# Patient Record
Sex: Male | Born: 1950 | Race: White | Hispanic: No | Marital: Single | Smoking: Never smoker
Health system: Southern US, Community
[De-identification: ages and names within clinical notes are randomized; demographics above are authoritative.]

## PROBLEM LIST (undated history)

## (undated) DIAGNOSIS — I10 Essential (primary) hypertension: Secondary | ICD-10-CM

## (undated) DIAGNOSIS — E785 Hyperlipidemia, unspecified: Secondary | ICD-10-CM

## (undated) HISTORY — PX: KNEE SURGERY: SHX244

## (undated) HISTORY — PX: ANKLE SURGERY: SHX546

## (undated) HISTORY — PX: NOSE SURGERY: SHX723

---

## 2014-07-05 ENCOUNTER — Emergency Department (HOSPITAL_COMMUNITY)
Admission: EM | Admit: 2014-07-05 | Discharge: 2014-07-05 | Disposition: A | Discharge status: Home / Self Care | Payer: Medicare Other | Attending: Emergency Medicine | Admitting: Emergency Medicine

## 2014-07-05 ENCOUNTER — Encounter (HOSPITAL_COMMUNITY): Payer: Self-pay | Admitting: Emergency Medicine

## 2014-07-05 DIAGNOSIS — R059 Cough, unspecified: Secondary | ICD-10-CM | POA: Insufficient documentation

## 2014-07-05 DIAGNOSIS — Z880 Allergy status to penicillin: Secondary | ICD-10-CM | POA: Insufficient documentation

## 2014-07-05 DIAGNOSIS — J3489 Other specified disorders of nose and nasal sinuses: Secondary | ICD-10-CM | POA: Diagnosis not present

## 2014-07-05 DIAGNOSIS — Z8619 Personal history of other infectious and parasitic diseases: Secondary | ICD-10-CM | POA: Insufficient documentation

## 2014-07-05 DIAGNOSIS — Z7982 Long term (current) use of aspirin: Secondary | ICD-10-CM | POA: Diagnosis not present

## 2014-07-05 DIAGNOSIS — R5381 Other malaise: Secondary | ICD-10-CM | POA: Diagnosis present

## 2014-07-05 DIAGNOSIS — G510 Bell's palsy: Secondary | ICD-10-CM | POA: Diagnosis not present

## 2014-07-05 DIAGNOSIS — Z792 Long term (current) use of antibiotics: Secondary | ICD-10-CM | POA: Diagnosis not present

## 2014-07-05 DIAGNOSIS — Z79899 Other long term (current) drug therapy: Secondary | ICD-10-CM | POA: Diagnosis not present

## 2014-07-05 DIAGNOSIS — R5383 Other fatigue: Secondary | ICD-10-CM

## 2014-07-05 DIAGNOSIS — I10 Essential (primary) hypertension: Secondary | ICD-10-CM | POA: Diagnosis not present

## 2014-07-05 DIAGNOSIS — R05 Cough: Secondary | ICD-10-CM | POA: Diagnosis not present

## 2014-07-05 HISTORY — DX: Essential (primary) hypertension: I10

## 2014-07-05 MED ORDER — VALACYCLOVIR HCL 1 G PO TABS: 1000.0000 mg | ORAL_TABLET | Freq: Three times a day (TID) | ORAL | Status: AC

## 2014-07-05 MED ORDER — PREDNISONE 10 MG PO TABS: ORAL_TABLET | ORAL | Status: DC

## 2014-07-05 MED ORDER — PREDNISONE 20 MG PO TABS
60.0000 mg | ORAL_TABLET | Freq: Once | ORAL | Status: AC
  Administered 2014-07-05: 60 mg via ORAL
  Filled 2014-07-05: qty 3

## 2014-07-05 MED ORDER — FLUORESCEIN SODIUM 1 MG OP STRP
1.0000 | ORAL_STRIP | Freq: Once | OPHTHALMIC | Status: AC
  Administered 2014-07-05: 1 via OPHTHALMIC
  Filled 2014-07-05: qty 1

## 2014-07-05 MED ORDER — VALACYCLOVIR HCL 500 MG PO TABS
1000.0000 mg | ORAL_TABLET | Freq: Once | ORAL | Status: AC
  Administered 2014-07-05: 1000 mg via ORAL
  Filled 2014-07-05 (×2): qty 2

## 2014-07-05 MED ORDER — PREDNISONE 10 MG PO TABS: 60.0000 mg | ORAL_TABLET | Freq: Every day | ORAL | Status: DC

## 2014-07-05 NOTE — ED Notes (Addendum)
Pt states L sided facial paralysis, also states L eye discomfort and watering. Onset Friday evening, L sided facial droop noted. Pt states numbness/tingling to L side of face. Speech clear, able to move all extremities. Strong equal bilateral grip strengths. Pt is alert and oriented x4.

## 2014-07-05 NOTE — ED Notes (Signed)
Pt presents with left side facial palsy onset Friday nite into Saturday. Speech clear, left sided facial droop. Equal grips.

## 2014-07-05 NOTE — Discharge Instructions (Signed)
Bell's Palsy °Bell's palsy is a condition in which the muscles on one side of the face cannot move (paralysis). This is because the nerves in the face are paralyzed. It is most often thought to be caused by a virus. The virus causes swelling of the nerve that controls movement on one side of the face. The nerve travels through a tight space surrounded by bone. When the nerve swells, it can be compressed by the bone. This results in damage to the protective covering around the nerve. This damage interferes with how the nerve communicates with the muscles of the face. As a result, it can cause weakness or paralysis of the facial muscles.  °Injury (trauma), tumor, and surgery may cause Bell's palsy, but most of the time the cause is unknown. It is a relatively common condition. It starts suddenly (abrupt onset) with the paralysis usually ending within 2 days. Bell's palsy is not dangerous. But because the eye does not close properly, you may need care to keep the eye from getting dry. This can include splinting (to keep the eye shut) or moistening with artificial tears. Bell's palsy very seldom occurs on both sides of the face at the same time. °SYMPTOMS  °· Eyebrow sagging. °· Drooping of the eyelid and corner of the mouth. °· Inability to close one eye. °· Loss of taste on the front of the tongue. °· Sensitivity to loud noises. °TREATMENT  °The treatment is usually non-surgical. If the patient is seen within the first 24 to 48 hours, a short course of steroids may be prescribed, in an attempt to shorten the length of the condition. Antiviral medicines may also be used with the steroids, but it is unclear if they are helpful.  °You will need to protect your eye, if you cannot close it. The cornea (clear covering over your eye) will become dry and can be damaged. Artificial tears can be used to keep your eye moist. Glasses or an eye patch should be worn to protect your eye. °PROGNOSIS  °Recovery is variable, ranging  from days to months. Although the problem usually goes away completely (about 80% of cases resolve), predicting the outcome is impossible. Most people improve within 3 weeks of when the symptoms began. Improvement may continue for 3 to 6 months. A small number of people have moderate to severe weakness that is permanent.  °HOME CARE INSTRUCTIONS  °· If your caregiver prescribed medication to reduce swelling in the nerve, use as directed. Do not stop taking the medication unless directed by your caregiver. °· Use moisturizing eye drops as needed to prevent drying of your eye, as directed by your caregiver. °· Protect your eye, as directed by your caregiver. °· Use facial massage and exercises, as directed by your caregiver. °· Perform your normal activities, and get your normal rest. °SEEK IMMEDIATE MEDICAL CARE IF:  °· There is pain, redness or irritation in the eye. °· You or your child has an oral temperature above 102° F (38.9° C), not controlled by medicine. °MAKE SURE YOU:  °· Understand these instructions. °· Will watch your condition. °· Will get help right away if you are not doing well or get worse. °Document Released: 11/06/2005 Document Revised: 01/29/2012 Document Reviewed: 02/13/2014 °ExitCare® Patient Information ©2015 [employer]. This information is not intended to replace advice given to you by your health care provider. Make sure you discuss any questions you have with your health care provider. ° °

## 2014-07-05 NOTE — ED Provider Notes (Signed)
CSN: [id number]     Arrival date & time 07/05/14  1605 History   First MD Initiated Contact with Patient 07/05/14 1623     Chief Complaint  Patient presents with  . Weakness     (Consider location/radiation/quality/duration/timing/severity/associated sxs/prior Treatment) Patient is a 63 y.o. male presenting with weakness. The history is provided by the patient.  Weakness This is a new problem. The current episode started in the past 7 days. The problem occurs constantly. The problem has been gradually worsening. Associated symptoms include congestion, coughing and weakness (facial). Pertinent negatives include no abdominal pain, arthralgias, chest pain, chills, fever, headaches, myalgias, rash or vomiting. Nothing aggravates the symptoms. He has tried nothing for the symptoms. The treatment provided no relief.   63 yo M with a chief complaint of left-sided facial weakness. Patient states this started a couple days ago. Initially started with some foreign body sensation itching to his left thigh. Patient states his eye became red and he started having some pain to the left side of his face. Patient was trying to drink something yesterday and realizes pooling of the left side of his mouth. Family urged him to come to the emergency department for evaluation. Patient with a history of shingles states that this feels the same. Patient denies any extremity weakness denies any numbness or tingling. Patient endorses some congestion cough.  Past Medical History  Diagnosis Date  . Hypertension    Past Surgical History  Procedure Laterality Date  . Ankle surgery    . Knee surgery    . Nose surgery     No family history on file. History  Substance Use Topics  . Smoking status: Never Smoker   . Smokeless tobacco: Not on file  . Alcohol Use: Yes    Review of Systems  Constitutional: Negative for fever and chills.  HENT: Positive for congestion. Negative for facial swelling.   Eyes: Negative  for discharge and visual disturbance.  Respiratory: Positive for cough. Negative for shortness of breath.   Cardiovascular: Negative for chest pain and palpitations.  Gastrointestinal: Negative for vomiting, abdominal pain and diarrhea.  Musculoskeletal: Negative for arthralgias and myalgias.  Skin: Negative for color change and rash.  Neurological: Positive for weakness (facial). Negative for tremors, syncope and headaches.  Psychiatric/Behavioral: Negative for confusion and dysphoric mood.      Allergies  Penicillins; Avelox; Xylocaine; Achromycin; Aureomycin; Cocaine; Erythromycin; Levaquin; Lincomycin; Other; Peanut-containing drug products; and Thorazine  Home Medications   Prior to Admission medications   Medication Sig Start Date End Date Taking? Authorizing Provider  amLODipine-olmesartan (AZOR) 5-40 MG per tablet Take 1 tablet by mouth daily.   Yes Historical Provider, MD  aspirin EC 81 MG tablet Take 81 mg by mouth daily.   Yes Historical Provider, MD  cefUROXime (CEFTIN) 250 MG tablet Take 250 mg by mouth 2 (two) times daily. 07/01/14  Yes Historical Provider, MD  Coenzyme Q10 (CO Q 10 PO) Take 1 tablet by mouth daily.   Yes Historical Provider, MD  Flaxseed, Linseed, (FLAX SEED OIL PO) Take 1 tablet by mouth daily.   Yes Historical Provider, MD  GARLIC PO Take 1 tablet by mouth daily.   Yes Historical Provider, MD  hydrOXYzine (ATARAX/VISTARIL) 25 MG tablet Take 25 mg by mouth daily as needed for itching (or allergies).   Yes Historical Provider, MD  Ketotifen Fumarate (ALAWAY OP) Place 1 drop into both eyes 2 (two) times daily as needed (for itchy, dry eyes).   Yes Historical  Provider, MD  Multiple Vitamins-Minerals (MULTIVITAMIN PO) Take 1 tablet by mouth daily.   Yes Historical Provider, MD  Omega-3 Fatty Acids (FISH OIL PO) Take 1 tablet by mouth daily.   Yes Historical Provider, MD  predniSONE (DELTASONE) 10 MG tablet Take 60 mg for the next four days.  Then take 50mg ,  40mg , 30mg , 20mg , 10mg . 07/05/14   Deno Etienne, MD  valACYclovir (VALTREX) 1000 MG tablet Take 1 tablet (1,000 mg total) by mouth 3 (three) times daily. 07/05/14 07/11/14  Deno Etienne, MD   BP 155/102  Pulse 113  Temp(Src) 98.2 F (36.8 C) (Oral)  Resp 20  Ht 5\' 10"  (1.778 m)  Wt 273 lb (123.832 kg)  BMI 39.17 kg/m2  SpO2 95% Physical Exam  Constitutional: He is oriented to person, place, and time. He appears well-developed and well-nourished.  HENT:  Head: Normocephalic and atraumatic.  Eyes: EOM are normal. Pupils are equal, round, and reactive to light. Lids are everted and swept, no foreign bodies found. Left eye exhibits exudate (watery). No foreign body present in the left eye. Left conjunctiva is injected. Left conjunctiva has no hemorrhage. Left eye exhibits normal extraocular motion.  Slit lamp exam:      The right eye shows no fluorescein uptake.       The left eye shows no corneal abrasion, no corneal ulcer, no hyphema, no hypopyon and no fluorescein uptake.  Neck: Normal range of motion. Neck supple. No JVD present.  Cardiovascular: Normal rate and regular rhythm.  Exam reveals no gallop and no friction rub.   No murmur heard. Pulmonary/Chest: No respiratory distress. He has no wheezes.  Abdominal: He exhibits no distension. There is no rebound and no guarding.  Musculoskeletal: Normal range of motion.  Neurological: He is alert and oriented to person, place, and time. He has normal strength. A cranial nerve deficit is present. No sensory deficit. He displays a negative Romberg sign. Coordination and gait normal. GCS eye subscore is 4. GCS verbal subscore is 5. GCS motor subscore is 6. He displays no Babinski's sign on the right side. He displays no Babinski's sign on the left side.  Reflex Scores:      Tricep reflexes are 2+ on the right side and 2+ on the left side.      Bicep reflexes are 2+ on the right side and 2+ on the left side.      Brachioradialis reflexes are 2+ on the  right side and 2+ on the left side.      Patellar reflexes are 2+ on the right side and 2+ on the left side.      Achilles reflexes are 2+ on the right side and 2+ on the left side. Paralysis of the left side of his face NOT sparing the forehead  Skin: No rash noted. No pallor.  Psychiatric: He has a normal mood and affect. His behavior is normal.    ED Course  Procedures (including critical care time) Labs Review Labs Reviewed - No data to display  Imaging Review No results found.   EKG Interpretation None      MDM   Final diagnoses:  Bell's palsy    63 yo M with a chief complaint left-sided facial weakness. This is not sparing the forehead. Patient with left itching eye, hx of zoster, fluorescein exam of the left side.  Fluorescein exam without noted herpetic lesions.  We'll discharge the patient home with a valacyclovir and prednisone. He'll follow with his PCP.  5:29 PM:  I have discussed the diagnosis/risks/treatment options with the patient and believe the pt to be eligible for discharge home to follow-up with PCP. We also discussed returning to the ED immediately if new or worsening sx occur. We discussed the sx which are most concerning (e.g., worsening vision loss) that necessitate immediate return. Medications administered to the patient during their visit and any new prescriptions provided to the patient are listed below.  Medications given during this visit Medications  predniSONE (DELTASONE) tablet 60 mg (not administered)  valACYclovir (VALTREX) tablet 1,000 mg (not administered)  fluorescein ophthalmic strip 1 strip (1 strip Left Eye Given 07/05/14 1652)    New Prescriptions   PREDNISONE (DELTASONE) 10 MG TABLET    Take 60 mg for the next four days.  Then take 50mg , 40mg , 30mg , 20mg , 10mg .   VALACYCLOVIR (VALTREX) 1000 MG TABLET    Take 1 tablet (1,000 mg total) by mouth 3 (three) times daily.     Deno Etienne, MD 07/05/14 [phone]

## 2014-07-05 NOTE — ED Provider Notes (Signed)
Patient with left-sided Bell's palsy. We'll give steroids and valacyclovir due 2 previous zoster  Jasper Riling. Alvino Chapel, MD 07/05/14 [phone]

## 2016-01-30 ENCOUNTER — Emergency Department (HOSPITAL_COMMUNITY): Payer: Medicare HMO

## 2016-01-30 ENCOUNTER — Encounter (HOSPITAL_COMMUNITY): Payer: Self-pay

## 2016-01-30 ENCOUNTER — Emergency Department (HOSPITAL_COMMUNITY)
Admission: EM | Admit: 2016-01-30 | Discharge: 2016-01-30 | Disposition: A | Discharge status: Home / Self Care | Payer: Medicare HMO | Attending: Emergency Medicine | Admitting: Emergency Medicine

## 2016-01-30 DIAGNOSIS — Z880 Allergy status to penicillin: Secondary | ICD-10-CM | POA: Diagnosis not present

## 2016-01-30 DIAGNOSIS — N201 Calculus of ureter: Secondary | ICD-10-CM | POA: Insufficient documentation

## 2016-01-30 DIAGNOSIS — E785 Hyperlipidemia, unspecified: Secondary | ICD-10-CM | POA: Insufficient documentation

## 2016-01-30 DIAGNOSIS — I10 Essential (primary) hypertension: Secondary | ICD-10-CM | POA: Insufficient documentation

## 2016-01-30 DIAGNOSIS — Z79899 Other long term (current) drug therapy: Secondary | ICD-10-CM | POA: Diagnosis not present

## 2016-01-30 DIAGNOSIS — Z7982 Long term (current) use of aspirin: Secondary | ICD-10-CM | POA: Insufficient documentation

## 2016-01-30 DIAGNOSIS — R1032 Left lower quadrant pain: Secondary | ICD-10-CM | POA: Diagnosis present

## 2016-01-30 LAB — COMPREHENSIVE METABOLIC PANEL
ALT: 23 U/L (ref 17–63)
ANION GAP: 14 (ref 5–15)
AST: 29 U/L (ref 15–41)
Albumin: 4.2 g/dL (ref 3.5–5.0)
Alkaline Phosphatase: 32 U/L — ABNORMAL LOW (ref 38–126)
BILIRUBIN TOTAL: 0.3 mg/dL (ref 0.3–1.2)
BUN: 21 mg/dL — ABNORMAL HIGH (ref 6–20)
CALCIUM: 9.8 mg/dL (ref 8.9–10.3)
CO2: 22 mmol/L (ref 22–32)
Chloride: 103 mmol/L (ref 101–111)
Creatinine, Ser: 1.54 mg/dL — ABNORMAL HIGH (ref 0.61–1.24)
GFR calc non Af Amer: 45 mL/min — ABNORMAL LOW (ref >60)
GFR, EST AFRICAN AMERICAN: 53 mL/min — AB (ref >60)
Glucose, Bld: 158 mg/dL — ABNORMAL HIGH (ref 65–99)
Potassium: 3.7 mmol/L (ref 3.5–5.1)
Sodium: 139 mmol/L (ref 135–145)
TOTAL PROTEIN: 7.2 g/dL (ref 6.5–8.1)

## 2016-01-30 LAB — URINALYSIS, ROUTINE W REFLEX MICROSCOPIC
BILIRUBIN URINE: NEGATIVE
Glucose, UA: NEGATIVE mg/dL
Ketones, ur: NEGATIVE mg/dL
Leukocytes, UA: NEGATIVE
NITRITE: NEGATIVE
Protein, ur: 100 mg/dL — AB
Specific Gravity, Urine: 1.024 (ref 1.005–1.030)
pH: 5 (ref 5.0–8.0)

## 2016-01-30 LAB — LIPASE, BLOOD: Lipase: 61 U/L — ABNORMAL HIGH (ref 11–51)

## 2016-01-30 LAB — CBC
HCT: 41.2 % (ref 39.0–52.0)
HEMOGLOBIN: 13.2 g/dL (ref 13.0–17.0)
MCH: 28.8 pg (ref 26.0–34.0)
MCHC: 32 g/dL (ref 30.0–36.0)
MCV: 89.8 fL (ref 78.0–100.0)
Platelets: 243 10*3/uL (ref 150–400)
RBC: 4.59 MIL/uL (ref 4.22–5.81)
RDW: 12.7 % (ref 11.5–15.5)
WBC: 9.1 10*3/uL (ref 4.0–10.5)

## 2016-01-30 LAB — URINE MICROSCOPIC-ADD ON
Bacteria, UA: NONE SEEN
SQUAMOUS EPITHELIAL / LPF: NONE SEEN
WBC, UA: NONE SEEN WBC/hpf (ref 0–5)

## 2016-01-30 MED ORDER — TAMSULOSIN HCL 0.4 MG PO CAPS: 0.4000 mg | ORAL_CAPSULE | Freq: Every day | ORAL | Status: DC

## 2016-01-30 MED ORDER — MORPHINE SULFATE (PF) 4 MG/ML IV SOLN
4.0000 mg | Freq: Once | INTRAVENOUS | Status: AC
  Administered 2016-01-30: 4 mg via INTRAVENOUS
  Filled 2016-01-30: qty 1

## 2016-01-30 MED ORDER — ONDANSETRON HCL 4 MG/2ML IJ SOLN
4.0000 mg | Freq: Once | INTRAMUSCULAR | Status: AC
  Administered 2016-01-30: 4 mg via INTRAVENOUS
  Filled 2016-01-30: qty 2

## 2016-01-30 MED ORDER — SODIUM CHLORIDE 0.9 % IV BOLUS (SEPSIS)
1000.0000 mL | Freq: Once | INTRAVENOUS | Status: AC
  Administered 2016-01-30: 1000 mL via INTRAVENOUS

## 2016-01-30 MED ORDER — OXYCODONE-ACETAMINOPHEN 5-325 MG PO TABS: 2.0000 | ORAL_TABLET | ORAL | Status: DC | PRN

## 2016-01-30 MED ORDER — HYDROMORPHONE HCL 1 MG/ML IJ SOLN
1.0000 mg | Freq: Once | INTRAMUSCULAR | Status: AC
  Administered 2016-01-30: 1 mg via INTRAVENOUS
  Filled 2016-01-30: qty 1

## 2016-01-30 MED ORDER — IOHEXOL 300 MG/ML  SOLN
80.0000 mL | Freq: Once | INTRAMUSCULAR | Status: AC | PRN
  Administered 2016-01-30: 100 mL via INTRAVENOUS

## 2016-01-30 NOTE — ED Notes (Signed)
Pt placed into gown and on to monitor upon arrival to room. Pt monitored by blood pressure and pulse ox.  

## 2016-01-30 NOTE — ED Notes (Signed)
Onset this morning left mid to lower back pain radiating at times around to left lower abd.  Vomited x 1.  No urinary symptoms.  Pt ambulated to room could not sit in wheelchair.

## 2016-01-30 NOTE — Discharge Instructions (Signed)
Kidney Stones Return for fever, vomiting, inability to pass urine, or worsening pain. Drink plenty of fluids. Take medications as prescribed. Follow-up with urology. Kidney stones (urolithiasis) are solid masses that form inside your kidneys. The intense pain is caused by the stone moving through the kidney, ureter, bladder, and urethra (urinary tract). When the stone moves, the ureter starts to spasm around the stone. The stone is usually passed in your pee (urine).  HOME CARE  Drink enough fluids to keep your pee clear or pale yellow. This helps to get the stone out.  Take a 24-hour pee (urine) sample as told by your doctor. You may need to take another sample every 6-12 months.  Strain all pee through the provided strainer. Do not pee without peeing through the strainer, not even once. If you pee the stone out, catch it in the strainer. The stone may be as small as a grain of salt. Take this to your doctor. This will help your doctor figure out what you can do to try to prevent more kidney stones.  Only take medicine as told by your doctor.  Make changes to your daily diet as told by your doctor. You may be told to:  Limit how much salt you eat.  Eat 5 or more servings of fruits and vegetables each day.  Limit how much meat, poultry, fish, and eggs you eat.  Keep all follow-up visits as told by your doctor. This is important.  Get follow-up X-rays as told by your doctor. GET HELP IF: You have pain that gets worse even if you have been taking pain medicine. GET HELP RIGHT AWAY IF:   Your pain does not get better with medicine.  You have a fever or shaking chills.  Your pain increases and gets worse over 18 hours.  You have new belly (abdominal) pain.  You feel faint or pass out.  You are unable to pee.   This information is not intended to replace advice given to you by your health care provider. Make sure you discuss any questions you have with your health care provider.   Document Released: 04/24/2008 Document Revised: 07/28/2015 Document Reviewed: 04/09/2013 Elsevier Interactive Patient Education [employer].

## 2016-01-30 NOTE — ED Provider Notes (Signed)
CSN: [id number]     Arrival date & time 01/30/16  1314 History   First MD Initiated Contact with Patient 01/30/16 1324     Chief Complaint  Patient presents with  . Flank Pain   (Consider location/radiation/quality/duration/timing/severity/associated sxs/prior Treatment) The history is provided by the patient. No language interpreter was used.   Donald Kim is a 65 y.o male with a history of kidney stones, hypertension and hyperlipidemia who presents for intermittent sharp, worsening left flank pain radiating to the left lower abdomen since 8:00 this morning. Denies any treatment prior to arrival. Reports he is nauseated and had one episode of vomiting prior to arrival. States this is similar to his previous kidney stones several years ago.  He denies any fever, chills, chest pain, shortness of breath, diarrhea, hematuria, urinary frequency, hematochezia.  Past Medical History  Diagnosis Date  . Hypertension    Past Surgical History  Procedure Laterality Date  . Ankle surgery    . Knee surgery    . Nose surgery     History reviewed. No pertinent family history. Social History  Substance Use Topics  . Smoking status: Never Smoker   . Smokeless tobacco: None  . Alcohol Use: Yes    Review of Systems  Constitutional: Negative for fever.  Gastrointestinal: Positive for nausea, vomiting and abdominal pain. Negative for diarrhea and blood in stool.  Genitourinary: Positive for flank pain. Negative for urgency and hematuria.  All other systems reviewed and are negative.     Allergies  Penicillins; Xylocaine; Avelox; Achromycin; Aureomycin; Cocaine; Erythromycin; Levaquin; Lincomycin; Other; and Thorazine  Home Medications   Prior to Admission medications   Medication Sig Start Date End Date Taking? Authorizing Provider  amLODipine-olmesartan (AZOR) 5-40 MG per tablet Take 1 tablet by mouth daily.   Yes Historical Provider, MD  aspirin EC 81 MG tablet Take 81 mg by mouth daily.    Yes Historical Provider, MD  Coenzyme Q10 (CO Q 10 PO) Take 1 tablet by mouth daily.   Yes Historical Provider, MD  fenofibrate micronized (LOFIBRA) 200 MG capsule Take 200 mg by mouth daily. 12/14/15  Yes Historical Provider, MD  Flaxseed, Linseed, (FLAX SEED OIL PO) Take 1 tablet by mouth daily.   Yes Historical Provider, MD  GARLIC PO Take 1 tablet by mouth daily.   Yes Historical Provider, MD  Multiple Vitamins-Minerals (MULTIVITAMIN PO) Take 1 tablet by mouth daily.   Yes Historical Provider, MD  Omega-3 Fatty Acids (FISH OIL PO) Take 1 tablet by mouth daily.   Yes Historical Provider, MD  rosuvastatin (CRESTOR) 20 MG tablet Take 20 mg by mouth daily. 12/14/15  Yes Historical Provider, MD  oxyCODONE-acetaminophen (PERCOCET/ROXICET) 5-325 MG tablet Take 2 tablets by mouth every 4 (four) hours as needed for severe pain. 01/30/16   Herchel Hopkin Patel-Mills, PA-C  predniSONE (DELTASONE) 10 MG tablet Take 60 mg for the next four days.  Then take 50mg , 40mg , 30mg , 20mg , 10mg . 07/05/14   Deno Etienne, DO  tamsulosin (FLOMAX) 0.4 MG CAPS capsule Take 1 capsule (0.4 mg total) by mouth daily. 01/30/16   Bridgett Hattabaugh Patel-Mills, PA-C   BP 125/82 mmHg  Pulse 89  Temp(Src) 97.5 F (36.4 C) (Oral)  Resp 16  Ht 5\' 9"  (1.753 m)  Wt 120.203 kg  BMI 39.12 kg/m2  SpO2 95% Physical Exam  Constitutional: He is oriented to person, place, and time. Vital signs are normal. He appears well-developed and well-nourished.  Diaphoretic. Morbidly obese.  HENT:  Head: Normocephalic and atraumatic.  Eyes: Conjunctivae are normal.  Neck: Normal range of motion. Neck supple.  Cardiovascular: Normal rate, regular rhythm and normal heart sounds.   RRR  Pulmonary/Chest: Effort normal and breath sounds normal.  Lungs clear to auscultation.  Abdominal: Soft. He exhibits no distension. There is no tenderness. There is no rebound, no guarding and no CVA tenderness.  Abdomen is soft and nontender. No distention. No guarding or rebound. No  reproducible CVA tenderness.  Musculoskeletal: Normal range of motion.  Neurological: He is alert and oriented to person, place, and time.  Skin: Skin is warm and dry.  Nursing note and vitals reviewed.   ED Course  Procedures (including critical care time) Labs Review Labs Reviewed  LIPASE, BLOOD - Abnormal; Notable for the following:    Lipase 61 (*)    All other components within normal limits  COMPREHENSIVE METABOLIC PANEL - Abnormal; Notable for the following:    Glucose, Bld 158 (*)    BUN 21 (*)    Creatinine, Ser 1.54 (*)    Alkaline Phosphatase 32 (*)    GFR calc non Af Amer 45 (*)    GFR calc Af Amer 53 (*)    All other components within normal limits  URINALYSIS, ROUTINE W REFLEX MICROSCOPIC (NOT AT [facility]) - Abnormal; Notable for the following:    APPearance CLOUDY (*)    Hgb urine dipstick LARGE (*)    Protein, ur 100 (*)    All other components within normal limits  CBC  URINE MICROSCOPIC-ADD ON    Imaging Review Ct Abdomen Pelvis W Contrast  01/30/2016  CLINICAL DATA:  65 year old male with history of left-sided flank and abdominal pain. EXAM: CT ABDOMEN AND PELVIS WITH CONTRAST TECHNIQUE: Multidetector CT imaging of the abdomen and pelvis was performed using the standard protocol following bolus administration of intravenous contrast. CONTRAST:  195mL OMNIPAQUE IOHEXOL 300 MG/ML  SOLN COMPARISON:  No priors. FINDINGS: Lower chest:  Mild scarring in the visualize lung bases bilaterally. Hepatobiliary: No cystic or solid hepatic lesions. No intra or extrahepatic biliary ductal dilatation. Several small calcified gallstones lie dependently in the gallbladder. No current findings to suggest an acute cholecystitis at this time. Pancreas: No pancreatic mass. No pancreatic ductal dilatation. No pancreatic or peripancreatic fluid or inflammatory changes. Spleen: Unremarkable. Adrenals/Urinary Tract: 6 mm calculus in the middle third of the left ureter (Image 74 of series 2).  Additional calcification in the left renal hilum is vascular. Mild proximal hydroureteronephrosis and perinephric stranding, indicative of very mild left-sided obstruction at this time. Appearance of the kidneys is otherwise normal. Urinary bladder is normal in appearance. Bilateral adrenal glands are normal in appearance. Stomach/Bowel: Normal appearance of the stomach. No pathologic dilatation of small bowel or colon. Numerous colonic diverticulae are noted, without surrounding inflammatory changes to suggest an acute diverticulitis at this time. Normal appendix. Vascular/Lymphatic: Atherosclerosis throughout the abdominal and pelvic vasculature, without evidence of aneurysm. No lymphadenopathy noted in the abdomen or pelvis. Reproductive: Prostate gland and seminal vesicles are unremarkable in appearance. Other: No significant volume of ascites.  No pneumoperitoneum. Musculoskeletal: There are no aggressive appearing lytic or blastic lesions noted in the visualized portions of the skeleton. IMPRESSION: 1. 6 mm calculus in the middle third of the left ureter with very mild proximal left hydroureteronephrosis and left perinephric stranding, indicative of mild obstruction at this time. 2. Cholelithiasis without evidence of acute cholecystitis at this time. 3. Normal appendix. 4. Atherosclerosis. Electronically Signed   By: Mauri Brooklyn.D.  On: 01/30/2016 15:42   I have personally reviewed and evaluated these images and lab results as part of my medical decision-making.   EKG Interpretation None      MDM   Final diagnoses:  Left ureteral stone  Patient presents for left lower quadrant abdominal pain and left-sided CVA tenderness. States it is similar to his previous pain with kidney stones in the past. He is afebrile but appears in significant pain. He is mildly dehydrated with hemoglobin in the urine. He has a 6 mm calculus in the middle third of the left ureter with mild hydroureteronephrosis  and left perinephric stranding.  He has mild obstruction at this time. No appendicitis. He does have diverticulosis but no diverticulitis.  I discussed findings with the patient. I discussed return precautions such as fever, vomiting, inability to pass urine, or increased pain. I discussed that I would be giving him Flomax and Percocet for pain. I also gave him urology follow-up. Patient is in agreement with plan.  Medications  morphine 4 MG/ML injection 4 mg (4 mg Intravenous Given 01/30/16 1401)  ondansetron (ZOFRAN) injection 4 mg (4 mg Intravenous Given 01/30/16 1400)  HYDROmorphone (DILAUDID) injection 1 mg (1 mg Intravenous Given 01/30/16 1452)  ondansetron (ZOFRAN) injection 4 mg (4 mg Intravenous Given 01/30/16 1452)  sodium chloride 0.9 % bolus 1,000 mL (1,000 mLs Intravenous New Bag/Given 01/30/16 1547)  iohexol (OMNIPAQUE) 300 MG/ML solution 80 mL (100 mLs Intravenous Contrast Given 01/30/16 1511)  morphine 4 MG/ML injection 4 mg (4 mg Intravenous Given 01/30/16 1602)   Filed Vitals:   01/30/16 1334 01/30/16 1600  BP: 147/93 125/82  Pulse: 81 89  Temp: 97.5 F (36.4 C)   Resp: 26 [id number], PA-C 01/30/16 1637  Julianne Rice, MD 01/31/16 [phone]

## 2016-02-03 ENCOUNTER — Emergency Department (HOSPITAL_COMMUNITY): Payer: Medicare HMO

## 2016-02-03 ENCOUNTER — Emergency Department (HOSPITAL_COMMUNITY)
Admission: EM | Admit: 2016-02-03 | Discharge: 2016-02-03 | Disposition: A | Discharge status: Home / Self Care | Payer: Medicare HMO | Attending: Emergency Medicine | Admitting: Emergency Medicine

## 2016-02-03 ENCOUNTER — Encounter (HOSPITAL_COMMUNITY): Payer: Self-pay | Admitting: Emergency Medicine

## 2016-02-03 DIAGNOSIS — Z7982 Long term (current) use of aspirin: Secondary | ICD-10-CM | POA: Diagnosis not present

## 2016-02-03 DIAGNOSIS — I10 Essential (primary) hypertension: Secondary | ICD-10-CM | POA: Insufficient documentation

## 2016-02-03 DIAGNOSIS — Z79899 Other long term (current) drug therapy: Secondary | ICD-10-CM | POA: Insufficient documentation

## 2016-02-03 DIAGNOSIS — E785 Hyperlipidemia, unspecified: Secondary | ICD-10-CM | POA: Insufficient documentation

## 2016-02-03 DIAGNOSIS — Z880 Allergy status to penicillin: Secondary | ICD-10-CM | POA: Insufficient documentation

## 2016-02-03 DIAGNOSIS — R1032 Left lower quadrant pain: Secondary | ICD-10-CM | POA: Diagnosis present

## 2016-02-03 DIAGNOSIS — N200 Calculus of kidney: Secondary | ICD-10-CM | POA: Insufficient documentation

## 2016-02-03 HISTORY — DX: Hyperlipidemia, unspecified: E78.5

## 2016-02-03 LAB — CBC WITH DIFFERENTIAL/PLATELET
BASOS ABS: 0 10*3/uL (ref 0.0–0.1)
BASOS PCT: 0 %
Eosinophils Absolute: 0.1 10*3/uL (ref 0.0–0.7)
Eosinophils Relative: 1 %
HCT: 38.7 % — ABNORMAL LOW (ref 39.0–52.0)
HEMOGLOBIN: 12.6 g/dL — AB (ref 13.0–17.0)
LYMPHS PCT: 12 %
Lymphs Abs: 1.1 10*3/uL (ref 0.7–4.0)
MCH: 29.5 pg (ref 26.0–34.0)
MCHC: 32.6 g/dL (ref 30.0–36.0)
MCV: 90.6 fL (ref 78.0–100.0)
MONO ABS: 0.6 10*3/uL (ref 0.1–1.0)
Monocytes Relative: 7 %
NEUTROS ABS: 7.6 10*3/uL (ref 1.7–7.7)
NEUTROS PCT: 80 %
Platelets: 262 10*3/uL (ref 150–400)
RBC: 4.27 MIL/uL (ref 4.22–5.81)
RDW: 13.1 % (ref 11.5–15.5)
WBC: 9.4 10*3/uL (ref 4.0–10.5)

## 2016-02-03 LAB — URINALYSIS, ROUTINE W REFLEX MICROSCOPIC
Bilirubin Urine: NEGATIVE
GLUCOSE, UA: NEGATIVE mg/dL
Ketones, ur: NEGATIVE mg/dL
LEUKOCYTES UA: NEGATIVE
NITRITE: NEGATIVE
PH: 5.5 (ref 5.0–8.0)
Protein, ur: 30 mg/dL — AB
Specific Gravity, Urine: 1.025 (ref 1.005–1.030)

## 2016-02-03 LAB — URINE MICROSCOPIC-ADD ON

## 2016-02-03 LAB — BASIC METABOLIC PANEL
Anion gap: 14 (ref 5–15)
BUN: 22 mg/dL — AB (ref 6–20)
CHLORIDE: 102 mmol/L (ref 101–111)
CO2: 25 mmol/L (ref 22–32)
Calcium: 9.4 mg/dL (ref 8.9–10.3)
Creatinine, Ser: 1.74 mg/dL — ABNORMAL HIGH (ref 0.61–1.24)
GFR calc Af Amer: 45 mL/min — ABNORMAL LOW (ref >60)
GFR calc non Af Amer: 39 mL/min — ABNORMAL LOW (ref >60)
Glucose, Bld: 134 mg/dL — ABNORMAL HIGH (ref 65–99)
POTASSIUM: 4 mmol/L (ref 3.5–5.1)
SODIUM: 141 mmol/L (ref 135–145)

## 2016-02-03 MED ORDER — MORPHINE SULFATE (PF) 4 MG/ML IV SOLN
4.0000 mg | Freq: Once | INTRAVENOUS | Status: AC
  Administered 2016-02-03: 4 mg via INTRAVENOUS
  Filled 2016-02-03: qty 1

## 2016-02-03 MED ORDER — ONDANSETRON HCL 4 MG/2ML IJ SOLN
4.0000 mg | Freq: Once | INTRAMUSCULAR | Status: AC
  Administered 2016-02-03: 4 mg via INTRAVENOUS
  Filled 2016-02-03: qty 2

## 2016-02-03 MED ORDER — OXYCODONE-ACETAMINOPHEN 5-325 MG PO TABS
ORAL_TABLET | ORAL | Status: DC
Start: 2016-02-03 — End: 2020-04-27

## 2016-02-03 MED ORDER — TAMSULOSIN HCL 0.4 MG PO CAPS: 0.4000 mg | ORAL_CAPSULE | Freq: Every day | ORAL | Status: DC

## 2016-02-03 MED ORDER — OXYCODONE-ACETAMINOPHEN 5-325 MG PO TABS: 1.0000 | ORAL_TABLET | Freq: Once | ORAL | Status: DC

## 2016-02-03 MED ORDER — ONDANSETRON HCL 4 MG PO TABS: 4.0000 mg | ORAL_TABLET | Freq: Three times a day (TID) | ORAL | Status: DC | PRN

## 2016-02-03 MED ORDER — SODIUM CHLORIDE 0.9 % IV BOLUS (SEPSIS)
1000.0000 mL | Freq: Once | INTRAVENOUS | Status: AC
  Administered 2016-02-03: 1000 mL via INTRAVENOUS

## 2016-02-03 NOTE — ED Notes (Signed)
Pt c/o left flank pain. States he was seen here on Sunday, 01/30/16 for same problem and was diagnosed with a kidney stone. Per pt, pain has not gotten better and stone has not passed.

## 2016-02-03 NOTE — ED Provider Notes (Signed)
CSN: [id number]     Arrival date & time 02/03/16  [id number] History   First MD Initiated Contact with Patient 02/03/16 [id number]     Chief Complaint  Patient presents with  . Flank Pain     (Consider location/radiation/quality/duration/timing/severity/associated sxs/prior Treatment) HPI   Blood pressure 148/87, pulse 80, temperature 98 F (36.7 C), temperature source Oral, resp. rate 20, height 5\' 9"  (1.753 m), weight 120.203 kg, SpO2 96 %.  Wlliam Kim is a 65 y.o. male complaining of  severe left flank and left lower quadrant pain radiating down to the left groin acute onset at 3 AM with associated nausea and no fever, chills, emesis. Patient was seen 4 days ago and diagnosed with a 6 mm mid left ureteral stone. He states that he take his pain medications that evening and by the next day the pain had spontaneously resolved. He hasn't needed any pain medication since then. He did not follow with urology. He did not take any pain edication this morning because he states he was coming to the emergency room. Patient has history of remote stone greater than 15 years ago. His never required any intervention to pass the stone. Patient denies dysuria, hematuria, urinary frequency, diarrhea, hematochezia.   Past Medical History  Diagnosis Date  . Hypertension   . Hyperlipidemia    Past Surgical History  Procedure Laterality Date  . Ankle surgery    . Knee surgery    . Nose surgery     No family history on file. Social History  Substance Use Topics  . Smoking status: Never Smoker   . Smokeless tobacco: None  . Alcohol Use: Yes     Comment: "occassionally"     Review of Systems  10 systems reviewed and found to be negative, except as noted in the HPI.  Allergies  Penicillins; Xylocaine; Avelox; Achromycin; Aureomycin; Cocaine; Erythromycin; Levaquin; Lincomycin; Other; and Thorazine  Home Medications   Prior to Admission medications   Medication Sig Start Date End Date Taking?  Authorizing Provider  amLODipine-olmesartan (AZOR) 5-40 MG per tablet Take 1 tablet by mouth daily.   Yes Historical Provider, MD  aspirin EC 81 MG tablet Take 81 mg by mouth daily.   Yes Historical Provider, MD  cefUROXime (CEFTIN) 250 MG tablet Take 250 mg by mouth 2 (two) times daily with a meal.   Yes Historical Provider, MD  Coenzyme Q10 (CO Q 10 PO) Take 1 tablet by mouth daily.   Yes Historical Provider, MD  fenofibrate micronized (LOFIBRA) 200 MG capsule Take 200 mg by mouth daily. 12/14/15  Yes Historical Provider, MD  Flaxseed, Linseed, (FLAX SEED OIL PO) Take 1 tablet by mouth daily.   Yes Historical Provider, MD  GARLIC PO Take 1 tablet by mouth daily.   Yes Historical Provider, MD  Multiple Vitamins-Minerals (MULTIVITAMIN PO) Take 1 tablet by mouth daily.   Yes Historical Provider, MD  naproxen sodium (ANAPROX) 220 MG tablet Take 440 mg by mouth daily as needed (pain).   Yes Historical Provider, MD  Omega-3 Fatty Acids (FISH OIL PO) Take 1 tablet by mouth daily.   Yes Historical Provider, MD  rosuvastatin (CRESTOR) 20 MG tablet Take 20 mg by mouth daily. 12/14/15  Yes Historical Provider, MD  ondansetron (ZOFRAN) 4 MG tablet Take 1 tablet (4 mg total) by mouth every 8 (eight) hours as needed for nausea or vomiting. 02/03/16   Mariadejesus Cade, PA-C  oxyCODONE-acetaminophen (PERCOCET/ROXICET) 5-325 MG tablet 1 to 2 tabs PO q6hrs  PRN for pain 02/03/16   Elmyra Ricks Hyatt Capobianco, PA-C  tamsulosin (FLOMAX) 0.4 MG CAPS capsule Take 1 capsule (0.4 mg total) by mouth daily. 02/03/16   Mariajose Mow, PA-C   BP 137/82 mmHg  Pulse 82  Temp(Src) 98 F (36.7 C) (Oral)  Resp 16  Ht 5\' 9"  (1.753 m)  Wt 120.203 kg  BMI 39.12 kg/m2  SpO2 96% Physical Exam  Constitutional: He is oriented to person, place, and time. He appears well-developed and well-nourished. No distress.  HENT:  Head: Normocephalic and atraumatic.  Mouth/Throat: Oropharynx is clear and moist.  Eyes: Conjunctivae and EOM are  normal. Pupils are equal, round, and reactive to light.  Neck: Normal range of motion.  Cardiovascular: Normal rate, regular rhythm and intact distal pulses.   Pulmonary/Chest: Effort normal and breath sounds normal.  Abdominal: Soft. There is no tenderness.  Genitourinary:  No CVA tenderness to percussion bilaterally.  Musculoskeletal: Normal range of motion.  Neurological: He is alert and oriented to person, place, and time.  Skin: He is not diaphoretic.  Psychiatric: He has a normal mood and affect.  Nursing note and vitals reviewed.   ED Course  Procedures (including critical care time) Labs Review Labs Reviewed  URINALYSIS, ROUTINE W REFLEX MICROSCOPIC (NOT AT [facility]) - Abnormal; Notable for the following:    Hgb urine dipstick TRACE (*)    Protein, ur 30 (*)    All other components within normal limits  CBC WITH DIFFERENTIAL/PLATELET - Abnormal; Notable for the following:    Hemoglobin 12.6 (*)    HCT 38.7 (*)    All other components within normal limits  BASIC METABOLIC PANEL - Abnormal; Notable for the following:    Glucose, Bld 134 (*)    BUN 22 (*)    Creatinine, Ser 1.74 (*)    GFR calc non Af Amer 39 (*)    GFR calc Af Amer 45 (*)    All other components within normal limits  URINE MICROSCOPIC-ADD ON - Abnormal; Notable for the following:    Squamous Epithelial / LPF 0-5 (*)    Bacteria, UA RARE (*)    All other components within normal limits    Imaging Review US Renal  02/03/2016  CLINICAL DATA:  Left flank pain EXAM: RENAL / URINARY TRACT ULTRASOUND COMPLETE COMPARISON:  CT abdomen pelvis 01/30/2016 FINDINGS: Right Kidney: Length: 12.4 cm. Echogenicity within normal limits. No mass or hydronephrosis visualized. Left Kidney: Length: 13.7 cm. Negative for obstruction. Note is made of mild obstruction of the left kidney due to ureteral calculus on the prior CT. If the patient continues to have left flank pain, consider follow-up CT abdomen pelvis to exclude ureteral  calculus Bladder: Urinary bladder empty and not evaluated IMPRESSION: Negative for hydronephrosis on the left. Note is made of left hydronephrosis by left ureteral calculus on the recent CT abdomen. The patient may have passed a stone. If symptoms persist, suggest follow-up CT abdomen pelvis without contrast to evaluate for retained ureteral stone. Electronically Signed   By: Franchot Gallo M.D.   On: 02/03/2016 07:31   Ct Renal Stone Study  02/03/2016  CLINICAL DATA:  Left flank pain EXAM: CT ABDOMEN AND PELVIS WITHOUT CONTRAST TECHNIQUE: Multidetector CT imaging of the abdomen and pelvis was performed following the standard protocol without oral or intravenous contrast material administration. COMPARISON:  January 30, 2016 CT abdomen and pelvis FINDINGS: Lower chest: There is mild bibasilar scarring with slight atelectatic change, slightly greater on the right than on the  left. Hepatobiliary: No focal liver lesions are identified on this noncontrast enhanced study. There is cholelithiasis. The gallbladder wall is not appreciably thickened. There is no biliary duct dilatation. Pancreas: No pancreatic mass or inflammatory focus. Spleen: No splenic lesions are identified. Adrenals/Urinary Tract: Adrenals appear normal bilaterally. There is no renal mass on either side. There is no hydronephrosis on the right. There is no right-sided renal or ureteral calculus. Left kidney is diffusely edematous with moderate perinephric stranding on the left. There is relatively mild hydronephrosis on the left. There is a 5 mm calculus in the distal left ureter currently at a level slightly superior to the left acetabulum. This calculus has migrated more distally compared to recent prior study. No new ureteral calculus identified. There is no intrarenal calculus on the left. There is a vascular calcification in the left renal hilum region, stable. Urinary bladder is midline with wall thickness within normal limits. Stomach/Bowel:  There is no bowel wall or mesenteric thickening. No bowel obstruction. No free air or portal venous air. There are sigmoid diverticula without diverticulitis. Vascular/Lymphatic: There is atherosclerotic calcification in multiple mesenteric vessels. Aorta is tortuous without aneurysm. There are foci of calcification in each common iliac artery as well as in the bifurcation region. There is no appreciable adenopathy in the abdomen or pelvis. Reproductive: There are prostatic calculi. Prostate and seminal vesicles appear normal in size and contour. There is no pelvic mass or pelvic fluid collection. Other: Appendix appears unremarkable. There is no abscess or ascites in the abdomen or pelvis. There is a small ventral hernia containing only fat. Musculoskeletal: There is degenerative change in the lower thoracic and lumbar spine regions. There are no blastic or lytic bone lesions. No intramuscular or abdominal wall lesions are apparent. IMPRESSION: The previously noted 5 mm calculus has migrated distally within the left ureter and is now located in the left ureter slightly superior to the level of the left acetabulum. There is relatively mild hydronephrosis on the left with left renal edema and perinephric stranding, increased from recent prior study. There is cholelithiasis. Gallbladder wall is not thickened, and there is no demonstrable pericholecystic fluid. Scattered sigmoid diverticula without diverticulitis. No bowel obstruction. No abscess. Appendix region appears normal. Prostatic calculi present. Small ventral hernia containing only fat. Electronically Signed   By: Lowella Grip III M.D.   On: 02/03/2016 08:46   I have personally reviewed and evaluated these images and lab results as part of my medical decision-making.   EKG Interpretation None      MDM   Final diagnoses:  Kidney stone on left side    Filed Vitals:   02/03/16 0632 02/03/16 0645 02/03/16 0700 02/03/16 0854  BP: 148/87  145/83 141/83 137/82  Pulse: 80 82 84 82  Temp: 98 F (36.7 C)     TempSrc: Oral     Resp: 20   16  Height: 5\' 9"  (1.753 m)     Weight: 120.203 kg     SpO2: 96% 97% 97% 96%    Medications  sodium chloride 0.9 % bolus 1,000 mL (1,000 mLs Intravenous New Bag/Given 02/03/16 0854)  oxyCODONE-acetaminophen (PERCOCET/ROXICET) 5-325 MG per tablet 1 tablet (not administered)  morphine 4 MG/ML injection 4 mg (4 mg Intravenous Given 02/03/16 0752)  ondansetron (ZOFRAN) injection 4 mg (4 mg Intravenous Given 02/03/16 0752)    Kenroy Vicencio is 65 y.o. male presenting with Left renal colic radiating to left lower quadrant and left flank severe, consistent with prior kidney stone  exacerbation without GU or GI symptoms. Abdominal exam benign. Patient afebrile, well-appearing has not tried pain medication this a.m. He is nausea without emesis.  Patient's ultrasound shows interval resolution of hydronephrosis, will obtain CT to further evaluate. Creatinine is elevated at 1.74, this is a very mild increase in his renal function from 1.5 for 4 days ago. Will bolus.   CT shows a 5 mm stone with improving hydronephrosis and progression through to the distal ureter.   Urology consult from Dr. Alyson Ingles appreciated: He has looked at the CAT scan and we have discussed the bump in his creatinine. States that patient will likely pass the stone on his own, he has encouraged him to make the appointment with urology regardless. I stressed to the patient the importance of straining his urine and close follow-up with urology. He has a few Percocet and Flomax at home, will write him a refill. Explained that he should not be taking both at once. Patient verbalized his understanding.  This is a shared visit with the attending physician who personally evaluated the patient and agrees with the care plan.   Evaluation does not show pathology that would require ongoing emergent intervention or inpatient treatment. Pt is  hemodynamically stable and mentating appropriately. Discussed findings and plan with patient/guardian, who agrees with care plan. All questions answered. Return precautions discussed and outpatient follow up given.   New Prescriptions   ONDANSETRON (ZOFRAN) 4 MG TABLET    Take 1 tablet (4 mg total) by mouth every 8 (eight) hours as needed for nausea or vomiting.   OXYCODONE-ACETAMINOPHEN (PERCOCET/ROXICET) 5-325 MG TABLET    1 to 2 tabs PO q6hrs  PRN for pain   TAMSULOSIN (FLOMAX) 0.4 MG CAPS CAPSULE    Take 1 capsule (0.4 mg total) by mouth daily.         Elmyra Ricks Soha Thorup, PA-C 02/03/16 [id number]  Blanchie Dessert, MD 02/03/16 1535

## 2016-02-03 NOTE — Discharge Instructions (Signed)
Continue to strain urine and urine.   It is very important that you make an appointment with urology as soon as possible.  Do not hesitate to return to the emergency room for any new, worsening or concerning symptoms.   Kidney Stones Kidney stones (urolithiasis) are deposits that form inside your kidneys. The intense pain is caused by the stone moving through the urinary tract. When the stone moves, the ureter goes into spasm around the stone. The stone is usually passed in the urine.  CAUSES   A disorder that makes certain neck glands produce too much parathyroid hormone (primary hyperparathyroidism).  A buildup of uric acid crystals, similar to gout in your joints.  Narrowing (stricture) of the ureter.  A kidney obstruction present at birth (congenital obstruction).  Previous surgery on the kidney or ureters.  Numerous kidney infections. SYMPTOMS   Feeling sick to your stomach (nauseous).  Throwing up (vomiting).  Blood in the urine (hematuria).  Pain that usually spreads (radiates) to the groin.  Frequency or urgency of urination. DIAGNOSIS   Taking a history and physical exam.  Blood or urine tests.  CT scan.  Occasionally, an examination of the inside of the urinary bladder (cystoscopy) is performed. TREATMENT   Observation.  Increasing your fluid intake.  Extracorporeal shock wave lithotripsy--This is a noninvasive procedure that uses shock waves to break up kidney stones.  Surgery may be needed if you have severe pain or persistent obstruction. There are various surgical procedures. Most of the procedures are performed with the use of small instruments. Only small incisions are needed to accommodate these instruments, so recovery time is minimized. The size, location, and chemical composition are all important variables that will determine the proper choice of action for you. Talk to your health care provider to better understand your situation so that you  will minimize the risk of injury to yourself and your kidney.  HOME CARE INSTRUCTIONS   Drink enough water and fluids to keep your urine clear or pale yellow. This will help you to pass the stone or stone fragments.  Strain all urine through the provided strainer. Keep all particulate matter and stones for your health care provider to see. The stone causing the pain may be as small as a grain of salt. It is very important to use the strainer each and every time you pass your urine. The collection of your stone will allow your health care provider to analyze it and verify that a stone has actually passed. The stone analysis will often identify what you can do to reduce the incidence of recurrences.  Only take over-the-counter or prescription medicines for pain, discomfort, or fever as directed by your health care provider.  Keep all follow-up visits as told by your health care provider. This is important.  Get follow-up X-rays if required. The absence of pain does not always mean that the stone has passed. It may have only stopped moving. If the urine remains completely obstructed, it can cause loss of kidney function or even complete destruction of the kidney. It is your responsibility to make sure X-rays and follow-ups are completed. Ultrasounds of the kidney can show blockages and the status of the kidney. Ultrasounds are not associated with any radiation and can be performed easily in a matter of minutes.  Make changes to your daily diet as told by your health care provider. You may be told to:  Limit the amount of salt that you eat.  Eat 5 or more  servings of fruits and vegetables each day.  Limit the amount of meat, poultry, fish, and eggs that you eat.  Collect a 24-hour urine sample as told by your health care provider.You may need to collect another urine sample every 6-12 months. SEEK MEDICAL CARE IF:  You experience pain that is progressive and unresponsive to any pain medicine you  have been prescribed. SEEK IMMEDIATE MEDICAL CARE IF:   Pain cannot be controlled with the prescribed medicine.  You have a fever or shaking chills.  The severity or intensity of pain increases over 18 hours and is not relieved by pain medicine.  You develop a new onset of abdominal pain.  You feel faint or pass out.  You are unable to urinate.   This information is not intended to replace advice given to you by your health care provider. Make sure you discuss any questions you have with your health care provider.   Document Released: 11/06/2005 Document Revised: 07/28/2015 Document Reviewed: 04/09/2013 Elsevier Interactive Patient Education [employer].

## 2020-04-27 ENCOUNTER — Inpatient Hospital Stay (HOSPITAL_COMMUNITY)
Admission: EM | Admit: 2020-04-27 | Discharge: 2020-05-02 | DRG: 378 | Disposition: A | Discharge status: Home / Self Care | Payer: Medicare HMO | Attending: Internal Medicine | Admitting: Internal Medicine

## 2020-04-27 ENCOUNTER — Emergency Department (HOSPITAL_COMMUNITY): Payer: Medicare HMO

## 2020-04-27 ENCOUNTER — Other Ambulatory Visit: Payer: Self-pay

## 2020-04-27 ENCOUNTER — Encounter (HOSPITAL_COMMUNITY): Payer: Self-pay

## 2020-04-27 DIAGNOSIS — R7989 Other specified abnormal findings of blood chemistry: Secondary | ICD-10-CM

## 2020-04-27 DIAGNOSIS — N1832 Chronic kidney disease, stage 3b: Secondary | ICD-10-CM | POA: Diagnosis present

## 2020-04-27 DIAGNOSIS — E878 Other disorders of electrolyte and fluid balance, not elsewhere classified: Secondary | ICD-10-CM | POA: Diagnosis present

## 2020-04-27 DIAGNOSIS — Z79899 Other long term (current) drug therapy: Secondary | ICD-10-CM

## 2020-04-27 DIAGNOSIS — K5900 Constipation, unspecified: Secondary | ICD-10-CM | POA: Diagnosis present

## 2020-04-27 DIAGNOSIS — K922 Gastrointestinal hemorrhage, unspecified: Secondary | ICD-10-CM | POA: Diagnosis present

## 2020-04-27 DIAGNOSIS — K2901 Acute gastritis with bleeding: Principal | ICD-10-CM | POA: Diagnosis present

## 2020-04-27 DIAGNOSIS — E872 Acidosis, unspecified: Secondary | ICD-10-CM | POA: Diagnosis present

## 2020-04-27 DIAGNOSIS — K2100 Gastro-esophageal reflux disease with esophagitis, without bleeding: Secondary | ICD-10-CM | POA: Diagnosis present

## 2020-04-27 DIAGNOSIS — N12 Tubulo-interstitial nephritis, not specified as acute or chronic: Secondary | ICD-10-CM

## 2020-04-27 DIAGNOSIS — R7401 Elevation of levels of liver transaminase levels: Secondary | ICD-10-CM | POA: Diagnosis present

## 2020-04-27 DIAGNOSIS — N189 Chronic kidney disease, unspecified: Secondary | ICD-10-CM | POA: Diagnosis present

## 2020-04-27 DIAGNOSIS — K319 Disease of stomach and duodenum, unspecified: Secondary | ICD-10-CM | POA: Diagnosis present

## 2020-04-27 DIAGNOSIS — E669 Obesity, unspecified: Secondary | ICD-10-CM | POA: Diagnosis present

## 2020-04-27 DIAGNOSIS — Z880 Allergy status to penicillin: Secondary | ICD-10-CM

## 2020-04-27 DIAGNOSIS — Z888 Allergy status to other drugs, medicaments and biological substances status: Secondary | ICD-10-CM

## 2020-04-27 DIAGNOSIS — K8020 Calculus of gallbladder without cholecystitis without obstruction: Secondary | ICD-10-CM | POA: Diagnosis present

## 2020-04-27 DIAGNOSIS — Z6839 Body mass index (BMI) 39.0-39.9, adult: Secondary | ICD-10-CM

## 2020-04-27 DIAGNOSIS — B9620 Unspecified Escherichia coli [E. coli] as the cause of diseases classified elsewhere: Secondary | ICD-10-CM | POA: Diagnosis present

## 2020-04-27 DIAGNOSIS — R197 Diarrhea, unspecified: Secondary | ICD-10-CM | POA: Diagnosis present

## 2020-04-27 DIAGNOSIS — K5730 Diverticulosis of large intestine without perforation or abscess without bleeding: Secondary | ICD-10-CM | POA: Diagnosis present

## 2020-04-27 DIAGNOSIS — D124 Benign neoplasm of descending colon: Secondary | ICD-10-CM | POA: Diagnosis present

## 2020-04-27 DIAGNOSIS — R195 Other fecal abnormalities: Secondary | ICD-10-CM | POA: Diagnosis present

## 2020-04-27 DIAGNOSIS — N390 Urinary tract infection, site not specified: Secondary | ICD-10-CM

## 2020-04-27 DIAGNOSIS — E785 Hyperlipidemia, unspecified: Secondary | ICD-10-CM | POA: Diagnosis present

## 2020-04-27 DIAGNOSIS — E7800 Pure hypercholesterolemia, unspecified: Secondary | ICD-10-CM | POA: Diagnosis present

## 2020-04-27 DIAGNOSIS — N136 Pyonephrosis: Secondary | ICD-10-CM | POA: Diagnosis present

## 2020-04-27 DIAGNOSIS — I959 Hypotension, unspecified: Secondary | ICD-10-CM | POA: Diagnosis not present

## 2020-04-27 DIAGNOSIS — K640 First degree hemorrhoids: Secondary | ICD-10-CM | POA: Diagnosis present

## 2020-04-27 DIAGNOSIS — N179 Acute kidney failure, unspecified: Secondary | ICD-10-CM | POA: Diagnosis present

## 2020-04-27 DIAGNOSIS — Z20822 Contact with and (suspected) exposure to covid-19: Secondary | ICD-10-CM | POA: Diagnosis present

## 2020-04-27 DIAGNOSIS — Z7982 Long term (current) use of aspirin: Secondary | ICD-10-CM

## 2020-04-27 DIAGNOSIS — D509 Iron deficiency anemia, unspecified: Secondary | ICD-10-CM | POA: Diagnosis present

## 2020-04-27 DIAGNOSIS — K760 Fatty (change of) liver, not elsewhere classified: Secondary | ICD-10-CM | POA: Diagnosis present

## 2020-04-27 DIAGNOSIS — D649 Anemia, unspecified: Secondary | ICD-10-CM

## 2020-04-27 DIAGNOSIS — B3781 Candidal esophagitis: Secondary | ICD-10-CM | POA: Diagnosis present

## 2020-04-27 DIAGNOSIS — I129 Hypertensive chronic kidney disease with stage 1 through stage 4 chronic kidney disease, or unspecified chronic kidney disease: Secondary | ICD-10-CM | POA: Diagnosis present

## 2020-04-27 LAB — IRON AND TIBC
Iron: 16 ug/dL — ABNORMAL LOW (ref 45–182)
Saturation Ratios: 7 % — ABNORMAL LOW (ref 17.9–39.5)
TIBC: 217 ug/dL — ABNORMAL LOW (ref 250–450)
UIBC: 201 ug/dL

## 2020-04-27 LAB — URINALYSIS, ROUTINE W REFLEX MICROSCOPIC
Bilirubin Urine: NEGATIVE
Glucose, UA: NEGATIVE mg/dL
Ketones, ur: NEGATIVE mg/dL
Nitrite: NEGATIVE
Protein, ur: 30 mg/dL — AB
Specific Gravity, Urine: 1.012 (ref 1.005–1.030)
pH: 5 (ref 5.0–8.0)

## 2020-04-27 LAB — ABO/RH: ABO/RH(D): A POS

## 2020-04-27 LAB — COMPREHENSIVE METABOLIC PANEL
ALT: 210 U/L — ABNORMAL HIGH (ref 0–44)
AST: 129 U/L — ABNORMAL HIGH (ref 15–41)
Albumin: 2.2 g/dL — ABNORMAL LOW (ref 3.5–5.0)
Alkaline Phosphatase: 184 U/L — ABNORMAL HIGH (ref 38–126)
Anion gap: 11 (ref 5–15)
BUN: 100 mg/dL — ABNORMAL HIGH (ref 8–23)
CO2: 15 mmol/L — ABNORMAL LOW (ref 22–32)
Calcium: 8.7 mg/dL — ABNORMAL LOW (ref 8.9–10.3)
Chloride: 115 mmol/L — ABNORMAL HIGH (ref 98–111)
Creatinine, Ser: 3.55 mg/dL — ABNORMAL HIGH (ref 0.61–1.24)
GFR calc Af Amer: 19 mL/min — ABNORMAL LOW (ref >60)
GFR calc non Af Amer: 16 mL/min — ABNORMAL LOW (ref >60)
Glucose, Bld: 164 mg/dL — ABNORMAL HIGH (ref 70–99)
Potassium: 4.8 mmol/L (ref 3.5–5.1)
Sodium: 141 mmol/L (ref 135–145)
Total Bilirubin: 0.6 mg/dL (ref 0.3–1.2)
Total Protein: 6.7 g/dL (ref 6.5–8.1)

## 2020-04-27 LAB — CBC
HCT: 25.6 % — ABNORMAL LOW (ref 39.0–52.0)
Hemoglobin: 7.9 g/dL — ABNORMAL LOW (ref 13.0–17.0)
MCH: 30.2 pg (ref 26.0–34.0)
MCHC: 30.9 g/dL (ref 30.0–36.0)
MCV: 97.7 fL (ref 80.0–100.0)
Platelets: 460 10*3/uL — ABNORMAL HIGH (ref 150–400)
RBC: 2.62 MIL/uL — ABNORMAL LOW (ref 4.22–5.81)
RDW: 14.6 % (ref 11.5–15.5)
WBC: 16.2 10*3/uL — ABNORMAL HIGH (ref 4.0–10.5)
nRBC: 0 % (ref 0.0–0.2)

## 2020-04-27 LAB — VITAMIN B12: Vitamin B-12: 1895 pg/mL — ABNORMAL HIGH (ref 180–914)

## 2020-04-27 LAB — RETICULOCYTES
Immature Retic Fract: 14.6 % (ref 2.3–15.9)
RBC.: 2.61 MIL/uL — ABNORMAL LOW (ref 4.22–5.81)
Retic Count, Absolute: 60.3 10*3/uL (ref 19.0–186.0)
Retic Ct Pct: 2.3 % (ref 0.4–3.1)

## 2020-04-27 LAB — FERRITIN: Ferritin: 684 ng/mL — ABNORMAL HIGH (ref 24–336)

## 2020-04-27 LAB — PREPARE RBC (CROSSMATCH)

## 2020-04-27 LAB — FOLATE: Folate: 13 ng/mL (ref >5.9)

## 2020-04-27 LAB — POC OCCULT BLOOD, ED: Fecal Occult Bld: POSITIVE — AB

## 2020-04-27 MED ORDER — SODIUM CHLORIDE 0.9 % IV SOLN
1.0000 g | Freq: Once | INTRAVENOUS | Status: AC
  Administered 2020-04-28: 1 g via INTRAVENOUS
  Filled 2020-04-27: qty 10

## 2020-04-27 MED ORDER — ATORVASTATIN CALCIUM 40 MG PO TABS
40.0000 mg | ORAL_TABLET | Freq: Every day | ORAL | Status: DC
  Administered 2020-04-28 – 2020-05-01 (×4): 40 mg via ORAL
  Filled 2020-04-27 (×5): qty 1

## 2020-04-27 MED ORDER — SODIUM CHLORIDE 0.9 % IV SOLN
1.0000 g | INTRAVENOUS | Status: DC
  Administered 2020-04-29 – 2020-05-01 (×4): 1 g via INTRAVENOUS
  Filled 2020-04-27 (×4): qty 10

## 2020-04-27 MED ORDER — SODIUM CHLORIDE 0.9 % IV BOLUS
1000.0000 mL | Freq: Once | INTRAVENOUS | Status: AC
  Administered 2020-04-27: 1000 mL via INTRAVENOUS

## 2020-04-27 MED ORDER — FERROUS SULFATE 325 (65 FE) MG PO TABS
325.0000 mg | ORAL_TABLET | Freq: Three times a day (TID) | ORAL | Status: DC
  Administered 2020-04-28 – 2020-05-02 (×11): 325 mg via ORAL
  Filled 2020-04-27 (×12): qty 1

## 2020-04-27 MED ORDER — HYDROCODONE-ACETAMINOPHEN 5-325 MG PO TABS: 1.0000 | ORAL_TABLET | Freq: Two times a day (BID) | ORAL | Status: DC | PRN

## 2020-04-27 MED ORDER — SODIUM CHLORIDE 0.9 % IV SOLN: 10.0000 mL/h | Freq: Once | INTRAVENOUS | Status: DC

## 2020-04-27 MED ORDER — FLUTICASONE PROPIONATE 50 MCG/ACT NA SUSP: 1.0000 | Freq: Every day | NASAL | Status: DC | PRN

## 2020-04-27 MED ORDER — ASPIRIN EC 81 MG PO TBEC
81.0000 mg | DELAYED_RELEASE_TABLET | Freq: Every day | ORAL | Status: DC
  Administered 2020-04-28: 81 mg via ORAL
  Filled 2020-04-27: qty 1

## 2020-04-27 MED ORDER — STERILE WATER FOR INJECTION IV SOLN
INTRAVENOUS | Status: DC
  Filled 2020-04-27 (×4): qty 850

## 2020-04-27 NOTE — ED Triage Notes (Signed)
Pt reports generalized weakness and fever for the past week. Pt seen by PCP and was told he had blood in his stool. Sent here for further evaluation. Pt appears pale. Denies any abd pain

## 2020-04-27 NOTE — ED Provider Notes (Signed)
[facility] Provider Note   CSN: [id number] Arrival date & time: 04/27/20  1622     History Chief Complaint  Patient presents with  . Weakness  . Blood In Stools    Donald Kim is a 69 y.o. male with history of hypertension, hyperlipidemia presents sent from PCP for evaluation of acute onset, progressively worsening generalized weakness and fatigue.  He reports that approximately 2 weeks ago at the end of May he developed fevers up to 104 F lasting for approximately 4 days.  This was associated with steatorrhea, foul-smelling diarrhea.  He denies abdominal pain.  He has had some nausea but denies vomiting, chest pain, shortness of breath, neck stiffness, recent travel or surgeries, urinary symptoms.  He reports he is more constipated at this time but denies melena or hematochezia.  He takes a baby aspirin but otherwise no anticoagulation.  He went to his PCP today due to persistent weakness and fatigue and was sent to the ED for evaluation of anemia.  He denies melena, hematochezia, or hematuria.  He reports he tested negative for Covid when his symptoms began.  The history is provided by the patient.       Past Medical History:  Diagnosis Date  . Hyperlipidemia   . Hypertension     Patient Active Problem List   Diagnosis Date Noted  . Acute-on-chronic kidney injury ([place]) 04/27/2020  . Transaminitis 04/27/2020  . Diarrhea 04/27/2020  . Positive fecal occult blood test 04/27/2020  . Iron deficiency anemia 04/27/2020  . Metabolic acidosis with normal anion gap and bicarbonate losses 04/27/2020  . Pyelonephritis of left kidney 04/27/2020    Past Surgical History:  Procedure Laterality Date  . ANKLE SURGERY    . KNEE SURGERY    . NOSE SURGERY         No family history on file.  Social History   Tobacco Use  . Smoking status: Never Smoker  Substance Use Topics  . Alcohol use: Yes    Comment: "occassionally"   . Drug use: No      Home Medications Prior to Admission medications   Medication Sig Start Date End Date Taking? Authorizing Provider  amLODipine-olmesartan (AZOR) 5-40 MG per tablet Take 1 tablet by mouth daily.   Yes [provider]  aspirin EC 81 MG tablet Take 81 mg by mouth daily.   Yes [provider]  atorvastatin (LIPITOR) 40 MG tablet Take 40 mg by mouth at bedtime. 04/15/20  Yes [provider]  Coenzyme Q10 (CO Q 10 PO) Take 1 capsule by mouth daily.    Yes [provider]  fexofenadine-pseudoephedrine (ALLEGRA-D) 60-120 MG 12 hr tablet Take 1 tablet by mouth 2 (two) times daily as needed (for allergic reactions).   Yes [provider]  Flaxseed, Linseed, (FLAX SEED OIL PO) Take 1 capsule by mouth daily.    Yes [provider]  fluticasone (FLONASE) 50 MCG/ACT nasal spray Place 1-2 sprays into both nostrils daily as needed for allergies or rhinitis.   Yes [provider]  GARLIC PO Take 1 tablet by mouth daily.   Yes [provider]  HYDROcodone-acetaminophen (NORCO) 7.5-325 MG tablet Take 1 tablet by mouth 2 (two) times daily as needed (for knee pain).  04/15/20  Yes [provider]  Multiple Vitamins-Minerals (MULTIVITAMIN PO) Take 1 tablet by mouth daily.   Yes [provider]  Omega-3 Fatty Acids (FISH OIL PO) Take 1 capsule by mouth daily.  Yes [provider]    Allergies    Penicillins, Xylocaine [lidocaine hcl], Avelox [moxifloxacin hcl in nacl], Avelox [moxifloxacin], Barley grass, Cabbage, Epinephrine, Polio virus vaccine live oral trivalent, Prednisone, Achromycin [tetracycline], Aureomycin [chlortetracycline], Cocaine, Ilotycin [erythromycin], Levaquin [levofloxacin in d5w], Lincomycin, Other, and Thorazine [chlorpromazine]  Review of Systems   Review of Systems  Constitutional: Positive for chills, fatigue and fever.  Respiratory: Negative for cough and shortness of breath.    Cardiovascular: Negative for chest pain.  Gastrointestinal: Positive for diarrhea and nausea. Negative for abdominal pain and vomiting.  All other systems reviewed and are negative.   Physical Exam Updated Vital Signs BP 111/72   Pulse (!) 110   Temp 98.7 F (37.1 C) (Oral)   Resp 14   Ht 5\' 10"  (1.778 m)   Wt 124.7 kg   SpO2 98%   BMI 39.46 kg/m   Physical Exam Vitals and nursing note reviewed.  Constitutional:      General: He is not in acute distress.    Appearance: He is well-developed.  HENT:     Head: Normocephalic and atraumatic.  Eyes:     General:        Right eye: No discharge.        Left eye: No discharge.     Conjunctiva/sclera: Conjunctivae normal.  Neck:     Vascular: No JVD.     Trachea: No tracheal deviation.  Cardiovascular:     Rate and Rhythm: Tachycardia present. Rhythm irregular.     Heart sounds: Normal heart sounds.  Pulmonary:     Effort: Pulmonary effort is normal.     Breath sounds: Normal breath sounds.  Abdominal:     General: Bowel sounds are normal. There is no distension.     Palpations: Abdomen is soft.     Tenderness: There is no abdominal tenderness. There is no guarding or rebound.  Genitourinary:    Comments: Examination performed in the presence of a chaperone.  No frank rectal bleeding.  Moderate amount of dark stool, almost black in the rectal vault. Musculoskeletal:     Cervical back: Neck supple.  Skin:    General: Skin is warm.     Coloration: Skin is pale.     Findings: No erythema.  Neurological:     Mental Status: He is alert.  Psychiatric:        Behavior: Behavior normal.     ED Results / Procedures / Treatments   Labs (all labs ordered are listed, but only abnormal results are displayed) Labs Reviewed  COMPREHENSIVE METABOLIC PANEL - Abnormal; Notable for the following components:      Result Value   Chloride 115 (*)    CO2 15 (*)    Glucose, Bld 164 (*)    BUN 100 (*)    Creatinine, Ser 3.55 (*)     Calcium 8.7 (*)    Albumin 2.2 (*)    AST 129 (*)    ALT 210 (*)    Alkaline Phosphatase 184 (*)    GFR calc non Af Amer 16 (*)    GFR calc Af Amer 19 (*)    All other components within normal limits  CBC - Abnormal; Notable for the following components:   WBC 16.2 (*)    RBC 2.62 (*)    Hemoglobin 7.9 (*)    HCT 25.6 (*)    Platelets 460 (*)    All other components within normal limits  VITAMIN B12 - Abnormal; Notable  for the following components:   Vitamin B-12 1,895 (*)    All other components within normal limits  IRON AND TIBC - Abnormal; Notable for the following components:   Iron 16 (*)    TIBC 217 (*)    Saturation Ratios 7 (*)    All other components within normal limits  FERRITIN - Abnormal; Notable for the following components:   Ferritin 684 (*)    All other components within normal limits  RETICULOCYTES - Abnormal; Notable for the following components:   RBC. 2.61 (*)    All other components within normal limits  URINALYSIS, ROUTINE W REFLEX MICROSCOPIC - Abnormal; Notable for the following components:   APPearance CLOUDY (*)    Hgb urine dipstick MODERATE (*)    Protein, ur 30 (*)    Leukocytes,Ua MODERATE (*)    Bacteria, UA RARE (*)    All other components within normal limits  POC OCCULT BLOOD, ED - Abnormal; Notable for the following components:   Fecal Occult Bld POSITIVE (*)    All other components within normal limits  SARS CORONAVIRUS 2 BY RT PCR (HOSPITAL ORDER, [facility])  URINE CULTURE  CULTURE, BLOOD (ROUTINE X 2)  CULTURE, BLOOD (ROUTINE X 2)  FOLATE  HEPATITIS PANEL, ACUTE  COMPREHENSIVE METABOLIC PANEL  LACTIC ACID, PLASMA  LACTIC ACID, PLASMA  CBC  TYPE AND SCREEN  ABO/RH  PREPARE RBC (CROSSMATCH)    EKG EKG Interpretation  Date/Time:  Tuesday April 27 2020 20:27:48 EDT Ventricular Rate:  114 PR Interval:    QRS Duration: 88 QT Interval:  311 QTC Calculation: 429 R Axis:   42 Text  Interpretation: Sinus tachycardia Multiple premature complexes, vent & supraven Abnormal ECG Confirmed by Carmin Muskrat [phone]) on 04/27/2020 8:37:36 PM   Radiology CT ABDOMEN PELVIS WO CONTRAST  Result Date: 04/27/2020 CLINICAL DATA:  Generalized weakness, blood in stool, elevated LFTs EXAM: CT ABDOMEN AND PELVIS WITHOUT CONTRAST TECHNIQUE: Multidetector CT imaging of the abdomen and pelvis was performed following the standard protocol without IV contrast. COMPARISON:  02/03/2016, 04/27/2020 FINDINGS: Lower chest: No acute pleural or parenchymal lung disease. Hepatobiliary: Calcified gallstones are identified without cholecystitis. Liver is unremarkable. Pancreas: Unremarkable. No pancreatic ductal dilatation or surrounding inflammatory changes. Spleen: Normal in size without focal abnormality. Adrenals/Urinary Tract: There is left perinephric and Peri ureteral fat stranding. I do not see any evidence of hydronephrosis. There is a punctate less than 2 mm nonobstructing calculus left kidney image 39. The appearance could reflect infection or recent passage of a calculus. Please correlate with urinalysis. The right kidney is unremarkable. The adrenals are normal. Bladder is unremarkable. Stomach/Bowel: No bowel obstruction or ileus. Diverticulosis of the distal colon without diverticulitis. No wall thickening or inflammatory change. Normal appendix right lower quadrant. Vascular/Lymphatic: Aortic atherosclerosis. There is a 2.2 by 2.5 cm low-attenuation structure along the left pelvic sidewall, just posterior to the left external iliac vein, reference image 84/3. This could reflect an enlarged lymph node. No other pathologic adenopathy. Reproductive: Prostate is unremarkable. Other: No free fluid or free gas. Small fat containing umbilical hernia unchanged. Musculoskeletal: No acute or destructive bony lesions. Reconstructed images demonstrate no additional findings. IMPRESSION: 1. Left perinephric and  periureteral fat stranding, without hydronephrosis. The appearance could reflect infection or recent passage of a calculus. Please correlate with urinalysis. 2. Punctate less than 2 mm nonobstructing left renal calculus. 3. Cholelithiasis without cholecystitis. 4. Diverticulosis without diverticulitis. 5. A 2.2 by 2.5 cm low-attenuation structure  along the left pelvic sidewall, just posterior to the left external iliac vein, likely an enlarged lymph node. Etiology indeterminate. Please correlate with any history of known malignancy. Short interval follow-up CT or PET-CT could be considered. 6. Aortic Atherosclerosis (ICD10-I70.0). Electronically Signed   By: Randa Ngo M.D.   On: 04/27/2020 21:27   US Abdomen Limited RUQ  Result Date: 04/27/2020 CLINICAL DATA:  Elevated LFTs EXAM: ULTRASOUND ABDOMEN LIMITED RIGHT UPPER QUADRANT COMPARISON:  None. FINDINGS: Gallbladder: No gallstones or wall thickening visualized. No sonographic Murphy sign noted by sonographer. Common bile duct: Diameter: 5 mm Liver: Diffuse increased echogenicity with slightly heterogeneous liver. Appearance typically secondary to fatty infiltration. Fibrosis secondary consideration. No secondary findings of cirrhosis noted. No focal hepatic lesion or intrahepatic biliary duct dilatation. Portal vein is patent on color Doppler imaging with normal direction of blood flow towards the liver. Other: None. IMPRESSION: 1. No evidence for cholelithiasis or acute cholecystitis. 2. Hepatic steatosis. Electronically Signed   By: Constance Holster M.D.   On: 04/27/2020 20:59    Procedures .Critical Care Performed by: Renita Papa, PA-C Authorized by: Renita Papa, PA-C   Critical care provider statement:    Critical care time (minutes):  45   Critical care was necessary to treat or prevent imminent or life-threatening deterioration of the following conditions:  Circulatory failure   Critical care was time spent personally by me on the  following activities:  Discussions with consultants, evaluation of patient's response to treatment, examination of patient, ordering and performing treatments and interventions, ordering and review of laboratory studies, ordering and review of radiographic studies, pulse oximetry, re-evaluation of patient's condition, obtaining history from patient or surrogate and review of old charts   (including critical care time)  Medications Ordered in ED Medications  0.9 %  sodium chloride infusion (0 mL/hr Intravenous Hold 04/27/20 2123)  ferrous sulfate tablet 325 mg (has no administration in time range)  sodium bicarbonate 150 mEq in sterile water 1,000 mL infusion (has no administration in time range)  atorvastatin (LIPITOR) tablet 40 mg (has no administration in time range)  aspirin EC tablet 81 mg (has no administration in time range)  fluticasone (FLONASE) 50 MCG/ACT nasal spray 1-2 spray (has no administration in time range)  HYDROcodone-acetaminophen (NORCO) 7.5-325 MG per tablet 1 tablet (has no administration in time range)  cefTRIAXone (ROCEPHIN) 1 g in sodium chloride 0.9 % 100 mL IVPB (has no administration in time range)  cefTRIAXone (ROCEPHIN) 1 g in sodium chloride 0.9 % 100 mL IVPB (has no administration in time range)  ondansetron (ZOFRAN) tablet 4 mg (has no administration in time range)    Or  ondansetron (ZOFRAN) injection 4 mg (has no administration in time range)  acetaminophen (TYLENOL) tablet 650 mg (has no administration in time range)    Or  acetaminophen (TYLENOL) suppository 650 mg (has no administration in time range)  sodium chloride 0.9 % bolus 1,000 mL (0 mLs Intravenous Stopped 04/27/20 2138)    ED Course  I have reviewed the triage vital signs and the nursing notes.  Pertinent labs & imaging results that were available during my care of the patient were reviewed by me and considered in my medical decision making (see chart for details).    MDM  Rules/Calculators/A&P                      Patient presenting for evaluation of generalized weakness, lightheadedness, recent history of high fevers and steatorrhea.  Denies abdominal pain, nausea, vomiting.  Abdomen soft and nontender.  He is afebrile in the ED, tachycardic but otherwise vital signs are stable.  Lab work reviewed and interpreted by myself shows leukocytosis of 16,000, anemia with hemoglobin of 7.9.  Platelet count is elevated.  CMP also shows elevated LFTs but total bilirubin is within normal limits.  Creatinine is 3.55, BUN 100.  Difficult to obtain baseline as most recent labs that I can compare to in our system are from 4 years ago.  However the patient does endorse decreased oral intake and I suspect that he is quite dehydrated.  Stools are heme positive but he has no frank rectal bleeding and he denies any melena or hematochezia.  In the setting of weakness and tachycardia we will transfuse 1 unit PRBCs which he consents to.  He was also given IV fluids as I do suspect he is dehydrated. Elevated BUN could indicate upper GI bleed additionally, though he has no hematemesis to support this.  Anemia panel was obtained.  He underwent right upper quadrant ultrasound which showed no evidence of cholelithiasis or acute cholecystitis but did show hepatic steatosis which could explain his elevated LFTs.  He underwent noncontrast CT scan of the abdomen and pelvis as he could not tolerate contrasted CT scan due to his renal insufficiency which showed left perinephric and periureteral fat stranding without hydronephrosis which could suggest recent passage of calculus although I think this is unlikely in the absence of any history of flank pain.  His UA is suggestive of possible UTI and with his leukocytosis I do think it would be reasonable to treat.  He has received cephalosporins previously so we will give a dose of IV Rocephin in the ED.  Spoke with Dr. Alcario Drought with [facility] hospitalist service who  agrees to assume care of patient and bring him into the hospital for further evaluation and management.    Final Clinical Impression(s) / ED Diagnoses Final diagnoses:  Elevated LFTs  Symptomatic anemia  AKI (acute kidney injury) ([place])  Urinary tract infection with hematuria, site unspecified  Heme positive stool    Rx / DC Orders ED Discharge Orders    None       Debroah Baller 04/28/20 0017    Carmin Muskrat, MD 04/29/20 2354

## 2020-04-27 NOTE — ED Notes (Signed)
Pt to US.

## 2020-04-27 NOTE — ED Notes (Signed)
Dr. Gardner at bedside 

## 2020-04-27 NOTE — ED Notes (Signed)
Unable to collect labs pt currently receiving blood transfusion

## 2020-04-28 ENCOUNTER — Inpatient Hospital Stay (HOSPITAL_COMMUNITY): Payer: Medicare HMO | Admitting: Anesthesiology

## 2020-04-28 ENCOUNTER — Encounter (HOSPITAL_COMMUNITY): Payer: Self-pay | Admitting: Internal Medicine

## 2020-04-28 ENCOUNTER — Encounter (HOSPITAL_COMMUNITY)
Admission: EM | Disposition: A | Discharge status: Home / Self Care | Payer: Self-pay | Source: Home / Self Care | Attending: Internal Medicine

## 2020-04-28 DIAGNOSIS — K922 Gastrointestinal hemorrhage, unspecified: Secondary | ICD-10-CM | POA: Diagnosis present

## 2020-04-28 DIAGNOSIS — N179 Acute kidney failure, unspecified: Secondary | ICD-10-CM | POA: Diagnosis present

## 2020-04-28 DIAGNOSIS — R7989 Other specified abnormal findings of blood chemistry: Secondary | ICD-10-CM

## 2020-04-28 DIAGNOSIS — D509 Iron deficiency anemia, unspecified: Secondary | ICD-10-CM

## 2020-04-28 DIAGNOSIS — D500 Iron deficiency anemia secondary to blood loss (chronic): Secondary | ICD-10-CM

## 2020-04-28 DIAGNOSIS — N136 Pyonephrosis: Secondary | ICD-10-CM | POA: Diagnosis present

## 2020-04-28 DIAGNOSIS — K319 Disease of stomach and duodenum, unspecified: Secondary | ICD-10-CM | POA: Diagnosis present

## 2020-04-28 DIAGNOSIS — E669 Obesity, unspecified: Secondary | ICD-10-CM | POA: Diagnosis present

## 2020-04-28 DIAGNOSIS — B9620 Unspecified Escherichia coli [E. coli] as the cause of diseases classified elsewhere: Secondary | ICD-10-CM | POA: Diagnosis present

## 2020-04-28 DIAGNOSIS — D649 Anemia, unspecified: Secondary | ICD-10-CM | POA: Diagnosis not present

## 2020-04-28 DIAGNOSIS — R197 Diarrhea, unspecified: Secondary | ICD-10-CM | POA: Diagnosis not present

## 2020-04-28 DIAGNOSIS — R195 Other fecal abnormalities: Secondary | ICD-10-CM

## 2020-04-28 DIAGNOSIS — K760 Fatty (change of) liver, not elsewhere classified: Secondary | ICD-10-CM | POA: Diagnosis present

## 2020-04-28 DIAGNOSIS — N12 Tubulo-interstitial nephritis, not specified as acute or chronic: Secondary | ICD-10-CM

## 2020-04-28 DIAGNOSIS — Z880 Allergy status to penicillin: Secondary | ICD-10-CM | POA: Diagnosis not present

## 2020-04-28 DIAGNOSIS — K2100 Gastro-esophageal reflux disease with esophagitis, without bleeding: Secondary | ICD-10-CM | POA: Diagnosis present

## 2020-04-28 DIAGNOSIS — Z79899 Other long term (current) drug therapy: Secondary | ICD-10-CM | POA: Diagnosis not present

## 2020-04-28 DIAGNOSIS — K8020 Calculus of gallbladder without cholecystitis without obstruction: Secondary | ICD-10-CM | POA: Diagnosis present

## 2020-04-28 DIAGNOSIS — D124 Benign neoplasm of descending colon: Secondary | ICD-10-CM | POA: Diagnosis present

## 2020-04-28 DIAGNOSIS — K2901 Acute gastritis with bleeding: Secondary | ICD-10-CM | POA: Diagnosis present

## 2020-04-28 DIAGNOSIS — Z20822 Contact with and (suspected) exposure to covid-19: Secondary | ICD-10-CM | POA: Diagnosis present

## 2020-04-28 DIAGNOSIS — E872 Acidosis: Secondary | ICD-10-CM | POA: Diagnosis present

## 2020-04-28 DIAGNOSIS — K2971 Gastritis, unspecified, with bleeding: Secondary | ICD-10-CM

## 2020-04-28 DIAGNOSIS — Z6839 Body mass index (BMI) 39.0-39.9, adult: Secondary | ICD-10-CM | POA: Diagnosis not present

## 2020-04-28 DIAGNOSIS — Z888 Allergy status to other drugs, medicaments and biological substances status: Secondary | ICD-10-CM | POA: Diagnosis not present

## 2020-04-28 DIAGNOSIS — K2951 Unspecified chronic gastritis with bleeding: Secondary | ICD-10-CM

## 2020-04-28 DIAGNOSIS — Z7982 Long term (current) use of aspirin: Secondary | ICD-10-CM | POA: Diagnosis not present

## 2020-04-28 DIAGNOSIS — R319 Hematuria, unspecified: Secondary | ICD-10-CM

## 2020-04-28 DIAGNOSIS — N183 Chronic kidney disease, stage 3 unspecified: Secondary | ICD-10-CM

## 2020-04-28 DIAGNOSIS — R7401 Elevation of levels of liver transaminase levels: Secondary | ICD-10-CM

## 2020-04-28 DIAGNOSIS — N390 Urinary tract infection, site not specified: Secondary | ICD-10-CM

## 2020-04-28 DIAGNOSIS — I959 Hypotension, unspecified: Secondary | ICD-10-CM | POA: Diagnosis not present

## 2020-04-28 DIAGNOSIS — B3781 Candidal esophagitis: Secondary | ICD-10-CM | POA: Diagnosis present

## 2020-04-28 DIAGNOSIS — I129 Hypertensive chronic kidney disease with stage 1 through stage 4 chronic kidney disease, or unspecified chronic kidney disease: Secondary | ICD-10-CM | POA: Diagnosis present

## 2020-04-28 DIAGNOSIS — K5730 Diverticulosis of large intestine without perforation or abscess without bleeding: Secondary | ICD-10-CM | POA: Diagnosis present

## 2020-04-28 DIAGNOSIS — E785 Hyperlipidemia, unspecified: Secondary | ICD-10-CM | POA: Diagnosis present

## 2020-04-28 DIAGNOSIS — N1832 Chronic kidney disease, stage 3b: Secondary | ICD-10-CM | POA: Diagnosis present

## 2020-04-28 HISTORY — PX: ESOPHAGOGASTRODUODENOSCOPY: SHX5428

## 2020-04-28 HISTORY — PX: ESOPHAGEAL BRUSHING: SHX6842

## 2020-04-28 LAB — COMPREHENSIVE METABOLIC PANEL
ALT: 192 U/L — ABNORMAL HIGH (ref 0–44)
AST: 121 U/L — ABNORMAL HIGH (ref 15–41)
Albumin: 2 g/dL — ABNORMAL LOW (ref 3.5–5.0)
Alkaline Phosphatase: 173 U/L — ABNORMAL HIGH (ref 38–126)
Anion gap: 10 (ref 5–15)
BUN: 90 mg/dL — ABNORMAL HIGH (ref 8–23)
CO2: 17 mmol/L — ABNORMAL LOW (ref 22–32)
Calcium: 8.5 mg/dL — ABNORMAL LOW (ref 8.9–10.3)
Chloride: 117 mmol/L — ABNORMAL HIGH (ref 98–111)
Creatinine, Ser: 3.27 mg/dL — ABNORMAL HIGH (ref 0.61–1.24)
GFR calc Af Amer: 21 mL/min — ABNORMAL LOW (ref >60)
GFR calc non Af Amer: 18 mL/min — ABNORMAL LOW (ref >60)
Glucose, Bld: 121 mg/dL — ABNORMAL HIGH (ref 70–99)
Potassium: 4.9 mmol/L (ref 3.5–5.1)
Sodium: 144 mmol/L (ref 135–145)
Total Bilirubin: 1.1 mg/dL (ref 0.3–1.2)
Total Protein: 6.2 g/dL — ABNORMAL LOW (ref 6.5–8.1)

## 2020-04-28 LAB — CBC
HCT: 24.7 % — ABNORMAL LOW (ref 39.0–52.0)
Hemoglobin: 7.7 g/dL — ABNORMAL LOW (ref 13.0–17.0)
MCH: 30.1 pg (ref 26.0–34.0)
MCHC: 31.2 g/dL (ref 30.0–36.0)
MCV: 96.5 fL (ref 80.0–100.0)
Platelets: 434 10*3/uL — ABNORMAL HIGH (ref 150–400)
RBC: 2.56 MIL/uL — ABNORMAL LOW (ref 4.22–5.81)
RDW: 14.5 % (ref 11.5–15.5)
WBC: 15 10*3/uL — ABNORMAL HIGH (ref 4.0–10.5)
nRBC: 0 % (ref 0.0–0.2)

## 2020-04-28 LAB — HEPATITIS PANEL, ACUTE
HCV Ab: NONREACTIVE
Hep A IgM: NONREACTIVE
Hep B C IgM: NONREACTIVE
Hepatitis B Surface Ag: NONREACTIVE

## 2020-04-28 LAB — HEMOGLOBIN AND HEMATOCRIT, BLOOD
HCT: 16.7 % — ABNORMAL LOW (ref 39.0–52.0)
HCT: 31.3 % — ABNORMAL LOW (ref 39.0–52.0)
Hemoglobin: 5.6 g/dL — CL (ref 13.0–17.0)
Hemoglobin: 10.3 g/dL — ABNORMAL LOW (ref 13.0–17.0)

## 2020-04-28 LAB — LACTIC ACID, PLASMA
Lactic Acid, Venous: 0.8 mmol/L (ref 0.5–1.9)
Lactic Acid, Venous: 0.8 mmol/L (ref 0.5–1.9)

## 2020-04-28 LAB — MRSA PCR SCREENING: MRSA by PCR: NEGATIVE

## 2020-04-28 LAB — CK: Total CK: 48 U/L — ABNORMAL LOW (ref 49–397)

## 2020-04-28 LAB — SARS CORONAVIRUS 2 BY RT PCR (HOSPITAL ORDER, PERFORMED IN ~~LOC~~ HOSPITAL LAB): SARS Coronavirus 2: NEGATIVE

## 2020-04-28 LAB — PREPARE RBC (CROSSMATCH)

## 2020-04-28 SURGERY — EGD (ESOPHAGOGASTRODUODENOSCOPY)
Anesthesia: General

## 2020-04-28 MED ORDER — FUROSEMIDE 10 MG/ML IJ SOLN: 20.0000 mg | Freq: Once | INTRAMUSCULAR | Status: DC

## 2020-04-28 MED ORDER — PROPOFOL 10 MG/ML IV BOLUS
INTRAVENOUS | Status: DC | PRN
  Administered 2020-04-28: 140 mg via INTRAVENOUS

## 2020-04-28 MED ORDER — CHLORHEXIDINE GLUCONATE CLOTH 2 % EX PADS
6.0000 | MEDICATED_PAD | Freq: Every day | CUTANEOUS | Status: DC
  Administered 2020-04-28 – 2020-05-01 (×3): 6 via TOPICAL

## 2020-04-28 MED ORDER — MIDAZOLAM HCL 2 MG/2ML IJ SOLN
INTRAMUSCULAR | Status: DC | PRN
  Administered 2020-04-28: 1 mg via INTRAVENOUS

## 2020-04-28 MED ORDER — DEXAMETHASONE SODIUM PHOSPHATE 10 MG/ML IJ SOLN
INTRAMUSCULAR | Status: DC | PRN
  Administered 2020-04-28: 5 mg via INTRAVENOUS

## 2020-04-28 MED ORDER — ONDANSETRON HCL 4 MG PO TABS: 4.0000 mg | ORAL_TABLET | Freq: Four times a day (QID) | ORAL | Status: DC | PRN

## 2020-04-28 MED ORDER — PEG 3350-KCL-NA BICARB-NACL 420 G PO SOLR: 4000.0000 mL | Freq: Once | ORAL | Status: DC

## 2020-04-28 MED ORDER — ACETAMINOPHEN 650 MG RE SUPP: 650.0000 mg | Freq: Four times a day (QID) | RECTAL | Status: DC | PRN

## 2020-04-28 MED ORDER — SODIUM CHLORIDE 0.9 % IV SOLN: INTRAVENOUS | Status: DC

## 2020-04-28 MED ORDER — FLUCONAZOLE IN SODIUM CHLORIDE 200-0.9 MG/100ML-% IV SOLN
200.0000 mg | INTRAVENOUS | Status: DC
  Administered 2020-04-28 – 2020-04-30 (×3): 200 mg via INTRAVENOUS
  Filled 2020-04-28 (×5): qty 100

## 2020-04-28 MED ORDER — ONDANSETRON HCL 4 MG/2ML IJ SOLN: 4.0000 mg | Freq: Four times a day (QID) | INTRAMUSCULAR | Status: DC | PRN

## 2020-04-28 MED ORDER — SODIUM CHLORIDE 0.9 % IV SOLN
8.0000 mg/h | INTRAVENOUS | Status: DC
  Administered 2020-04-28 – 2020-04-29 (×3): 8 mg/h via INTRAVENOUS
  Filled 2020-04-28 (×5): qty 80

## 2020-04-28 MED ORDER — PANTOPRAZOLE SODIUM 40 MG IV SOLR: 40.0000 mg | Freq: Two times a day (BID) | INTRAVENOUS | Status: DC

## 2020-04-28 MED ORDER — SODIUM CHLORIDE 0.9 % IV SOLN
80.0000 mg | Freq: Once | INTRAVENOUS | Status: AC
  Administered 2020-04-28: 80 mg via INTRAVENOUS
  Filled 2020-04-28: qty 80

## 2020-04-28 MED ORDER — ONDANSETRON HCL 4 MG/2ML IJ SOLN
INTRAMUSCULAR | Status: DC | PRN
  Administered 2020-04-28: 4 mg via INTRAVENOUS

## 2020-04-28 MED ORDER — SODIUM CHLORIDE 0.9% IV SOLUTION: Freq: Once | INTRAVENOUS | Status: AC

## 2020-04-28 MED ORDER — DIPHENHYDRAMINE HCL 25 MG PO CAPS: 25.0000 mg | ORAL_CAPSULE | Freq: Once | ORAL | Status: DC

## 2020-04-28 MED ORDER — LACTATED RINGERS IV SOLN: INTRAVENOUS | Status: DC

## 2020-04-28 MED ORDER — PEG 3350-KCL-NA BICARB-NACL 420 G PO SOLR
4000.0000 mL | Freq: Once | ORAL | Status: AC
  Administered 2020-04-29: 4000 mL via ORAL
  Filled 2020-04-28: qty 4000

## 2020-04-28 MED ORDER — SUCCINYLCHOLINE CHLORIDE 20 MG/ML IJ SOLN
INTRAMUSCULAR | Status: DC | PRN
  Administered 2020-04-28: 140 mg via INTRAVENOUS

## 2020-04-28 MED ORDER — FENTANYL CITRATE (PF) 100 MCG/2ML IJ SOLN
INTRAMUSCULAR | Status: DC | PRN
  Administered 2020-04-28: 50 ug via INTRAVENOUS

## 2020-04-28 MED ORDER — ACETAMINOPHEN 325 MG PO TABS: 650.0000 mg | ORAL_TABLET | Freq: Four times a day (QID) | ORAL | Status: DC | PRN

## 2020-04-28 MED ORDER — SODIUM CHLORIDE 0.9% IV SOLUTION: Freq: Once | INTRAVENOUS | Status: DC

## 2020-04-28 MED ORDER — ACETAMINOPHEN 325 MG PO TABS: 650.0000 mg | ORAL_TABLET | Freq: Once | ORAL | Status: DC

## 2020-04-28 NOTE — ED Notes (Signed)
Pt ambulated to bathroom with standby assist. Steady gait demonstrated.

## 2020-04-28 NOTE — Op Note (Signed)
[facility] Patient Name: Donald Kim Procedure Date : 04/28/2020 MRN: [id number] Attending MD: [name] , MD Date of Birth: [birth year] CSN: [id number] Age: 69 Admit Type: Outpatient Procedure:                Upper GI endoscopy Indications:              Active gastrointestinal bleeding, Melena Providers:                [name], MD, Burtis Junes, RN, Elspeth Cho Tech., Technician, Sampson Si, CRNA Referring MD:             hospital team Medicines:                Propofol per Anesthesia, Monitored Anesthesia Care Complications:            No immediate complications. Estimated Blood Loss:     Estimated blood loss: none. Procedure:                Pre-Anesthesia Assessment:                           - Prior to the procedure, a History and Physical                            was performed, and patient medications and                            allergies were reviewed. The patient's tolerance of                            previous anesthesia was also reviewed. The risks                            and benefits of the procedure and the sedation                            options and risks were discussed with the patient.                            All questions were answered, and informed consent                            was obtained. Prior Anticoagulants: The patient has                            taken no previous anticoagulant or antiplatelet                            agents. ASA Grade Assessment: III - A patient with                            severe systemic disease. After reviewing the risks  and benefits, the patient was deemed in                            satisfactory condition to undergo the procedure.                           After obtaining informed consent, the endoscope was                            passed under direct vision. Throughout the                            procedure, the  patient's blood pressure, pulse, and                            oxygen saturations were monitored continuously. The                            [id number]-H190 ([id number] Olympus gastroscope was                            introduced through the mouth, and advanced to the                            second part of duodenum. The upper GI endoscopy was                            accomplished without difficulty. The patient                            tolerated the procedure well. Scope In: Scope Out: Findings:      Esophagitis with no bleeding was found in the mid esophagus. Cells for       cytology were obtained by brushing.      LA Grade A (one or more mucosal breaks less than 5 mm, not extending       between tops of 2 mucosal folds) esophagitis with no bleeding was found       in the distal esophagus.      The Z-line was found 42 cm from the incisors.      A few localized small erosions with stigmata of recent bleeding were       found in the gastric antrum.      Patchy mild inflammation characterized by congestion (edema) and       erythema was found in the gastric antrum.      The examined duodenum was normal.      Patchy moderate inflammation characterized by congestion (edema) and       erosions was found in the gastric body. Impression:               - Candidiasis esophagitis with no bleeding. Cells                            for cytology obtained.                           -  LA Grade A reflux esophagitis with no bleeding.                           - Z-line, 42 cm from the incisors.                           - Erosive gastropathy with stigmata of recent                            bleeding.                           - Acute gastritis.                           - Normal examined duodenum.                           - Acute gastritis. Recommendation:           - NPO.                           - Observe patient's clinical course. Procedure Code(s):        --- Professional ---                            [phone], Esophagogastroduodenoscopy, flexible,                            transoral; diagnostic, including collection of                            specimen(s) by brushing or washing, when performed                            (separate procedure) Diagnosis Code(s):        --- Professional ---                           K29.00, Acute gastritis without bleeding                           K92.2, Gastrointestinal hemorrhage, unspecified                           K21.00, Gastro-esophageal reflux disease with                            esophagitis, without bleeding                           B37.81, Candidal esophagitis                           K92.1, Melena (includes Hematochezia) CPT copyright 2019 [employer]. All rights reserved. The codes documented in this report are preliminary and upon coder review may  be revised to meet current compliance requirements. [name], MD 04/28/2020 2:33:48  PM This report has been signed electronically. Number of Addenda: 0

## 2020-04-28 NOTE — H&P (Signed)
History and Physical    Donald Kim ZRA:[id number] DOB: [birth year] DOA: 04/27/2020  PCP: Tonye Becket., MD  Patient coming from: Home  I have personally briefly reviewed patient's old medical records in [place]  Chief Complaint: Weakness, blood in stool  HPI: Donald Kim is a 69 y.o. male with medical history significant of HTN, HLD.  Pt sent in to ED from PCP for lab work which demonstrates a new anemia.  Pt has had progressively worsening generalized weakness and fatigue over past 2 weeks.  2 weeks ago he developed fevers to 104 for about 4 days.  COVID was neg.  Had associated steatorrhea and diarrhea with this.  Some nausea, no vomiting.  No abd pain, no flank pain, no dysuria.  Diarrhea resolved, weakness persisted.  Pt went to PCP today.  PCP drew labs, sent to ED for evaluation of anemia.   ED Course: HGB 7.9, Creat 3.55 up from 1.7 in 2017, BUN 100, afebrile in ED.  HR 100-110 sinus.  SBP 93-114.  WBC 16.2k, AST 129 ALT 210.  UA with mod blood, mod LE, 6-10 wbs, 0 RBC.  Bicarb 15, AG 11.  Iron 16.  CT abd/pelvis results as noted below in note.   Review of Systems: As per HPI, otherwise all review of systems negative.  Past Medical History:  Diagnosis Date  . Hyperlipidemia   . Hypertension     Past Surgical History:  Procedure Laterality Date  . ANKLE SURGERY    . KNEE SURGERY    . NOSE SURGERY       reports that he has never smoked. He does not have any smokeless tobacco history on file. He reports current alcohol use. He reports that he does not use drugs.  Allergies  Allergen Reactions  . Penicillins Rash and Other (See Comments)    Childhood allergy  . Xylocaine [Lidocaine Hcl] Other (See Comments)    Muscles started contracting, jaws were grinding  . Avelox [Moxifloxacin Hcl In Nacl] Swelling and Rash  . Avelox [Moxifloxacin] Other (See Comments)    Reaction not recalled  . Barley Grass Itching  . Cabbage Other (See Comments)     Tested allergic  . Epinephrine Other (See Comments)    Jaws locked  . Polio Virus Vaccine Live Oral Trivalent Other (See Comments)    Blacked out  . Prednisone Other (See Comments)    Reaction??  . Achromycin [Tetracycline] Rash  . Aureomycin [Chlortetracycline] Other (See Comments)    Unknown reaction  . Cocaine Other (See Comments)    Childhood allergy for medicinal purposes  . Ilotycin [Erythromycin] Other (See Comments)    Childhood allergy   . Levaquin [Levofloxacin In D5w] Hives and Other (See Comments)    "Crazy dreams," also  . Lincomycin Other (See Comments)    Childhood allergy  . Other Other (See Comments)    Compenamine, small pox vaccine - Childhood allergy  . Thorazine [Chlorpromazine] Other (See Comments)    Unknown reaction     No family history on file. No sick contacts.  Prior to Admission medications   Medication Sig Start Date End Date Taking? Authorizing Provider  amLODipine-olmesartan (AZOR) 5-40 MG per tablet Take 1 tablet by mouth daily.   Yes [provider]  aspirin EC 81 MG tablet Take 81 mg by mouth daily.   Yes [provider]  atorvastatin (LIPITOR) 40 MG tablet Take 40 mg by mouth at bedtime. 04/15/20  Yes [provider]  Coenzyme  Q10 (CO Q 10 PO) Take 1 capsule by mouth daily.    Yes [provider]  fexofenadine-pseudoephedrine (ALLEGRA-D) 60-120 MG 12 hr tablet Take 1 tablet by mouth 2 (two) times daily as needed (for allergic reactions).   Yes [provider]  Flaxseed, Linseed, (FLAX SEED OIL PO) Take 1 capsule by mouth daily.    Yes [provider]  fluticasone (FLONASE) 50 MCG/ACT nasal spray Place 1-2 sprays into both nostrils daily as needed for allergies or rhinitis.   Yes [provider]  GARLIC PO Take 1 tablet by mouth daily.   Yes [provider]  HYDROcodone-acetaminophen (NORCO) 7.5-325 MG tablet Take 1 tablet by mouth 2 (two) times daily as needed (for knee  pain).  04/15/20  Yes [provider]  Multiple Vitamins-Minerals (MULTIVITAMIN PO) Take 1 tablet by mouth daily.   Yes [provider]  Omega-3 Fatty Acids (FISH OIL PO) Take 1 capsule by mouth daily.    Yes [provider]    Physical Exam: Vitals:   04/27/20 2153 04/27/20 2245 04/27/20 2300 04/27/20 2315  BP: (!) 121/98   111/72  Pulse: (!) 109 (!) 112 (!) 108 (!) 110  Resp: 20 20 (!) 22 14  Temp: 98.7 F (37.1 C)     TempSrc: Oral     SpO2: 100% 100% 99% 98%  Weight:      Height:        Constitutional: NAD, calm, comfortable Eyes: PERRL, lids and conjunctivae normal ENMT: Mucous membranes are moist. Posterior pharynx clear of any exudate or lesions.Normal dentition.  Neck: normal, supple, no masses, no thyromegaly Respiratory: clear to auscultation bilaterally, no wheezing, no crackles. Normal respiratory effort. No accessory muscle use.  Cardiovascular: Regular rate and rhythm, no murmurs / rubs / gallops. No extremity edema. 2+ pedal pulses. No carotid bruits.  Abdomen: no tenderness, no masses palpated. No hepatosplenomegaly. Bowel sounds positive.  Musculoskeletal: no clubbing / cyanosis. No joint deformity upper and lower extremities. Good ROM, no contractures. Normal muscle tone.  Skin: no rashes, lesions, ulcers. No induration Neurologic: CN 2-12 grossly intact. Sensation intact, DTR normal. Strength 5/5 in all 4.  Psychiatric: Normal judgment and insight. Alert and oriented x 3. Normal mood.    Labs on Admission: I have personally reviewed following labs and imaging studies  CBC: Recent Labs  Lab 04/27/20 1658  WBC 16.2*  HGB 7.9*  HCT 25.6*  MCV 97.7  PLT 025*   Basic Metabolic Panel: Recent Labs  Lab 04/27/20 1658  NA 141  K 4.8  CL 115*  CO2 15*  GLUCOSE 164*  BUN 100*  CREATININE 3.55*  CALCIUM 8.7*   GFR: Estimated Creatinine Clearance: 25.3 mL/min (A) (by C-G formula based on SCr of 3.55 mg/dL (H)). Liver Function  Tests: Recent Labs  Lab 04/27/20 1658  AST 129*  ALT 210*  ALKPHOS 184*  BILITOT 0.6  PROT 6.7  ALBUMIN 2.2*   No results for input(s): LIPASE, AMYLASE in the last 168 hours. No results for input(s): AMMONIA in the last 168 hours. Coagulation Profile: No results for input(s): INR, PROTIME in the last 168 hours. Cardiac Enzymes: No results for input(s): CKTOTAL, CKMB, CKMBINDEX, TROPONINI in the last 168 hours. BNP (last 3 results) No results for input(s): PROBNP in the last 8760 hours. HbA1C: No results for input(s): HGBA1C in the last 72 hours. CBG: No results for input(s): GLUCAP in the last 168 hours. Lipid Profile: No results for input(s): CHOL, HDL, LDLCALC,  TRIG, CHOLHDL, LDLDIRECT in the last 72 hours. Thyroid Function Tests: No results for input(s): TSH, T4TOTAL, FREET4, T3FREE, THYROIDAB in the last 72 hours. Anemia Panel: Recent Labs    04/27/20 2026  VITAMINB12 1,895*  FOLATE 13.0  FERRITIN 684*  TIBC 217*  IRON 16*  RETICCTPCT 2.3   Urine analysis:    Component Value Date/Time   COLORURINE YELLOW 04/27/2020 2201   APPEARANCEUR CLOUDY (A) 04/27/2020 2201   LABSPEC 1.012 04/27/2020 2201   PHURINE 5.0 04/27/2020 2201   GLUCOSEU NEGATIVE 04/27/2020 2201   HGBUR MODERATE (A) 04/27/2020 2201   BILIRUBINUR NEGATIVE 04/27/2020 2201   KETONESUR NEGATIVE 04/27/2020 2201   PROTEINUR 30 (A) 04/27/2020 2201   NITRITE NEGATIVE 04/27/2020 2201   LEUKOCYTESUR MODERATE (A) 04/27/2020 2201    Radiological Exams on Admission: CT ABDOMEN PELVIS WO CONTRAST  Result Date: 04/27/2020 CLINICAL DATA:  Generalized weakness, blood in stool, elevated LFTs EXAM: CT ABDOMEN AND PELVIS WITHOUT CONTRAST TECHNIQUE: Multidetector CT imaging of the abdomen and pelvis was performed following the standard protocol without IV contrast. COMPARISON:  02/03/2016, 04/27/2020 FINDINGS: Lower chest: No acute pleural or parenchymal lung disease. Hepatobiliary: Calcified gallstones are  identified without cholecystitis. Liver is unremarkable. Pancreas: Unremarkable. No pancreatic ductal dilatation or surrounding inflammatory changes. Spleen: Normal in size without focal abnormality. Adrenals/Urinary Tract: There is left perinephric and Peri ureteral fat stranding. I do not see any evidence of hydronephrosis. There is a punctate less than 2 mm nonobstructing calculus left kidney image 39. The appearance could reflect infection or recent passage of a calculus. Please correlate with urinalysis. The right kidney is unremarkable. The adrenals are normal. Bladder is unremarkable. Stomach/Bowel: No bowel obstruction or ileus. Diverticulosis of the distal colon without diverticulitis. No wall thickening or inflammatory change. Normal appendix right lower quadrant. Vascular/Lymphatic: Aortic atherosclerosis. There is a 2.2 by 2.5 cm low-attenuation structure along the left pelvic sidewall, just posterior to the left external iliac vein, reference image 84/3. This could reflect an enlarged lymph node. No other pathologic adenopathy. Reproductive: Prostate is unremarkable. Other: No free fluid or free gas. Small fat containing umbilical hernia unchanged. Musculoskeletal: No acute or destructive bony lesions. Reconstructed images demonstrate no additional findings. IMPRESSION: 1. Left perinephric and periureteral fat stranding, without hydronephrosis. The appearance could reflect infection or recent passage of a calculus. Please correlate with urinalysis. 2. Punctate less than 2 mm nonobstructing left renal calculus. 3. Cholelithiasis without cholecystitis. 4. Diverticulosis without diverticulitis. 5. A 2.2 by 2.5 cm low-attenuation structure along the left pelvic sidewall, just posterior to the left external iliac vein, likely an enlarged lymph node. Etiology indeterminate. Please correlate with any history of known malignancy. Short interval follow-up CT or PET-CT could be considered. 6. Aortic  Atherosclerosis (ICD10-I70.0). Electronically Signed   By: Randa Ngo M.D.   On: 04/27/2020 21:27   US Abdomen Limited RUQ  Result Date: 04/27/2020 CLINICAL DATA:  Elevated LFTs EXAM: ULTRASOUND ABDOMEN LIMITED RIGHT UPPER QUADRANT COMPARISON:  None. FINDINGS: Gallbladder: No gallstones or wall thickening visualized. No sonographic Murphy sign noted by sonographer. Common bile duct: Diameter: 5 mm Liver: Diffuse increased echogenicity with slightly heterogeneous liver. Appearance typically secondary to fatty infiltration. Fibrosis secondary consideration. No secondary findings of cirrhosis noted. No focal hepatic lesion or intrahepatic biliary duct dilatation. Portal vein is patent on color Doppler imaging with normal direction of blood flow towards the liver. Other: None. IMPRESSION: 1. No evidence for cholelithiasis or acute cholecystitis. 2. Hepatic steatosis. Electronically Signed   By: Harrell Gave  Green M.D.   On: 04/27/2020 20:59    EKG: Independently reviewed.  Assessment/Plan Principal Problem:   Pyelonephritis of left kidney Active Problems:   Acute-on-chronic kidney injury (HCC)   Transaminitis   Diarrhea   Positive fecal occult blood test   Iron deficiency anemia   Metabolic acidosis with normal anion gap and bicarbonate losses    1. Recent febrile illness - 1. ?Pyelonephritis of L kidney given findings on CT 2. Or maybe a now resolved GI pathogen? 3. BCx pending 4. UCx pending 5. Empiric rocephin for the moment 6. Repeat CBC to trend leukocytosis in AM 7. IVF: 1L bolus in ED then 125 cc/hr isotonic bicarb 8. Lactate pending 2. Anemia, iron deficiency, and heme+ stool - 1. Also has a lymph node noted on CT scan 2. 1u PRBC transfusion being done in ED 3. Repeat CBC in AM 4. Starting PO iron 5. Call GI in AM 3. AKI on CKD 3 - with NAG metabolic acidosis - 1. Pre-renal DVV:OHYWV ratio 2. Holding Azor 3. IVF: 1L bolus in ED then 125 cc/hr isotonic bicarb 4. CPK  pending given the positive heme on urine dipstick but neg RBC on microscopic 1. May need to increase IVF rate depending on result 5. Strict intake and output 6. Repeat CMP in AM 7. May need nephro consult in AM 4. Transaminitis - 1. Unclear cause, likely due to recent illness 2. CT and RUQ [place] without any acute findings 3. Trend transaminases with daily CMPs 5. HTN - holding home Azor  DVT prophylaxis: SCDs Code Status: Full Family Communication: No family in room Disposition Plan: Home after medically stabilized Consults called: None Admission status: Admit to inpatient  Severity of Illness: The appropriate patient status for this patient is INPATIENT. Inpatient status is judged to be reasonable and necessary in order to provide the required intensity of service to ensure the patient's safety. The patient's presenting symptoms, physical exam findings, and initial radiographic and laboratory data in the context of their chronic comorbidities is felt to place them at high risk for further clinical deterioration. Furthermore, it is not anticipated that the patient will be medically stable for discharge from the hospital within 2 midnights of admission. The following factors support the patient status of inpatient.   IP status due to creat 3.55 up from 1.7 baseline, BUN 100, among the other issues noted above.   * I certify that at the point of admission it is my clinical judgment that the patient will require inpatient hospital care spanning beyond 2 midnights from the point of admission due to high intensity of service, high risk for further deterioration and high frequency of surveillance required.*    Jakel Alphin M. DO [facility]  How to contact the [facility] The Attending or Consulting provider [place] or covering provider during after hours [place], for this patient?  1. Check the care team in [facility] and look for a) attending/consulting [facility] provider listed and b) the [facility] team  listed 2. Log into [email or web address]  Amion Physician Scheduling and messaging for groups and whole hospitals  On call and physician scheduling software for group practices, residents, hospitalists and other medical providers for call, clinic, rotation and shift schedules. On[employer] is a hospital-wide system for scheduling doctors and paging doctors on call. EasyPlot is for scientific plotting and data analysis.  [email or web address]  and use [place]'s universal password to access. If you do not have the password, please contact the hospital operator.  3.  Locate the [facility] provider you are looking for under [facility] and page to a number that you can be directly reached. 4. If you still have difficulty reaching the provider, please page the [facility] (Director on Call) for the Hospitalists listed on amion for assistance.  04/28/2020, 12:13 AM

## 2020-04-28 NOTE — Consult Note (Signed)
NAME:  Donald Kim, MRN:  [id number], DOB:  [birth year], LOS: 0 ADMISSION DATE:  04/27/2020, CONSULTATION DATE:  6/9 REFERRING MD:  Grandville Silos, CHIEF COMPLAINT:  Acute GIB   Brief History   69 year old male admitted 6/9 with presumptive diagnosis of upper GI bleed critical care asked to evaluate after rapid drop in hemoglobin from 7.7 to 5.6 in the emergency room with concern about hemodilution decline History of present illness   69 year old white male presents to the emergency room With chief complaint of weakness.  This was after being referred by his PCP with hemoglobin of 7.9.  Currently has had about a 2-week history of diarrhea which he described as a licorice colored.  Worsening weakness, and episode of fever as high as 104, some nausea, no abdominal pain, no flank pain no dysuria.  The weakness persists, he spent most of the last 2 weeks doing minimal activity.  Diarrhea has been on and off, as reported above presented to PCP with these complaints found to have hemoglobin of 7.9 and sent to the emergency room.  In the ER CT of abdomen pelvis was obtained showing left perinephritic and periurethral fat stranding hydronephrosis, nonobstructing left renal calculus cholelithiasis without cholecystitis diverticulosis without diverticulitis and a 2.2 x 2.5 low-attenuation structure on the pelvic wall felt could be a enlarged lymph node but not clear at time of imaging.  Ultrasound was obtained this was negative for cholelithiasis or cholecystitis he was to be admitted with working diagnosis of possible pyelonephritis, also found to have acute on chronic renal failure and for further evaluation of his newly identified anemia.  He was transfused 1 unit of blood, in spite of transfusion his hemoglobin went from 7.9 to 7.7, therefore GI was consulted.  Initially as he was hemodynamically stable the plan was for him to undergo EGD and colonoscopy the following day.  After noon follow-up hemoglobin was obtained  once again showing hemoglobin down to 5.6, blood was ordered, transfusions initiated, EGD moved up to emergent, pulmonary asked to evaluate and assist with management given concern about hemodynamic stability.  Past Medical History  Hypertension, obesity, degenerative joint disease.  Significant Hospital Events   6/9 admitted  Consults:  GI consulted 6/9 Critical care consulted   Procedures:    Significant Diagnostic Tests:  EGD 6/9>>> 6/8 ultrasound abdomen:No evidence of cholelithiasis or acute cholecystitis it did demonstrate hepatic steatosis 6/8 CT abdomen pelvis: 1. Left perinephric and periureteral fat stranding, without hydronephrosis. The appearance could reflect infection or recent passage of a calculus. Please correlate with urinalysis. 2. Punctate less than 2 mm nonobstructing left renal calculus.3. Cholelithiasis without cholecystitis. 4. Diverticulosis without diverticulitis.5. A 2.2 by 2.5 cm low-attenuation structure along the left pelvic sidewall, just posterior to the left external iliac vein, likely an enlarged lymph node. Etiology indeterminate Micro Data:  BCX2 6/9>>>  Antimicrobials:  Rocephin 6/8>>>  Interim history/subjective:  No distress   Objective   Blood pressure 111/77, pulse 92, temperature 98.7 F (37.1 C), temperature source Oral, resp. rate 17, height 5\' 10"  (1.778 m), weight 124.7 kg, SpO2 96 %.        Intake/Output Summary (Last 24 hours) at 04/28/2020 1322 Last data filed at 04/28/2020 1114 Gross per 24 hour  Intake 1050 ml  Output 520 ml  Net 530 ml   Filed Weights   04/27/20 1631  Weight: 124.7 kg    Examination: General: This is a pale white gentleman sitting upright in bed he is in no acute distress  currently HENT: Normocephalic atraumatic mucous membranes pale, no neck vein distention sclera nonicteric Lungs: Auscultation without accessory use currently on room air Cardiovascular: Mildly tachycardic with a rate in 100s no  murmur rub or gallop Abdomen: Soft nontender bowel sounds present no organomegaly Extremities: Warm and dry, dependent edema Neuro: Awake oriented no focal deficits GU: Voids spontaneously  Resolved Hospital Problem list     Assessment & Plan:   Acute blood loss anemia in the setting of acute GI bleed patient describing licorice appearing diarrhea over 2-week course with associated fatigue and malaise.  Had isolated fever. Hemoglobin acutely dropped in the emergency room Plan Continue Protonix infusion Complete 2 more units of PRBC Transferred emergently to Endo suite for EGD Further recommendations to be determined following EGD results  SIRS/rule out sepsis: Possible pyelonephritis, CT showing perinephric stranding involving the left kidney.  Did have fever approximately 1 week ago. -Lactate normal -CT and ultrasound both negative for obstruction Plan Agree with empiric ceftriaxone Culture blood and urine IV hydration  Acute on chronic renal failure -Baseline serum creatinine appears around 1.74, acutely up to 3.55 Plan Continuing supportive care with IV hydration and volume resuscitation Renal dose medications Strict intake output Serial chemistries  Fluid and electrolyte imbalance:  Hyperchloremia with mild non-anion gap metabolic acidosis in the setting of sodium chloride resuscitation Plan Changing maintenance IV fluid balance crystalloid Serial chemistries  Mild transaminitis Suspect shock liver. Plan Trend LFTs Continue supportive care  History of hypertension Plan Holding antihypertensives  History of hypercholesterolemia Plan Holding statin   Best practice:  Diet: NPO Pain/Anxiety/Delirium protocol (if indicated): NA VAP protocol (if indicated): NA DVT prophylaxis: scd GI prophylaxis: PPI Glucose control: fasting glucose  Mobility: BR Code Status: full code  Family Communication: BR Disposition: to ICU post endo   Labs   CBC: Recent Labs    Lab 04/27/20 1658 04/28/20 0229 04/28/20 1115  WBC 16.2* 15.0*  --   HGB 7.9* 7.7* 5.6*  HCT 25.6* 24.7* 16.7*  MCV 97.7 96.5  --   PLT 460* 434*  --     Basic Metabolic Panel: Recent Labs  Lab 04/27/20 1658 04/28/20 0229  NA 141 144  K 4.8 4.9  CL 115* 117*  CO2 15* 17*  GLUCOSE 164* 121*  BUN 100* 90*  CREATININE 3.55* 3.27*  CALCIUM 8.7* 8.5*   GFR: Estimated Creatinine Clearance: 27.5 mL/min (A) (by C-G formula based on SCr of 3.27 mg/dL (H)). Recent Labs  Lab 04/27/20 1658 04/28/20 0229 04/28/20 0505  WBC 16.2* 15.0*  --   LATICACIDVEN  --  0.8 0.8    Liver Function Tests: Recent Labs  Lab 04/27/20 1658 04/28/20 0229  AST 129* 121*  ALT 210* 192*  ALKPHOS 184* 173*  BILITOT 0.6 1.1  PROT 6.7 6.2*  ALBUMIN 2.2* 2.0*   No results for input(s): LIPASE, AMYLASE in the last 168 hours. No results for input(s): AMMONIA in the last 168 hours.  ABG No results found for: PHART, PCO2ART, PO2ART, HCO3, TCO2, ACIDBASEDEF, O2SAT   Coagulation Profile: No results for input(s): INR, PROTIME in the last 168 hours.  Cardiac Enzymes: Recent Labs  Lab 04/28/20 0229  CKTOTAL 48*    HbA1C: No results found for: HGBA1C  CBG: No results for input(s): GLUCAP in the last 168 hours.  Review of Systems:   Review of Systems  Constitutional: Positive for malaise/fatigue. Negative for fever.  HENT: Negative.   Eyes: Negative.   Respiratory: Negative.   Cardiovascular: Positive for  chest pain. Negative for leg swelling.  Gastrointestinal: Positive for diarrhea.       Diarrhea w/ dark licorice colored stool  Genitourinary: Negative.   Musculoskeletal: Negative.   Neurological: Negative.   Endo/Heme/Allergies: Negative.   Psychiatric/Behavioral: Negative.      Past Medical History  He,  has a past medical history of Hyperlipidemia and Hypertension.   Surgical History    Past Surgical History:  Procedure Laterality Date  . ANKLE SURGERY    . KNEE  SURGERY    . NOSE SURGERY       Social History   reports that he has never smoked. He does not have any smokeless tobacco history on file. He reports current alcohol use. He reports that he does not use drugs.   Family History   His family history is not on file.   Allergies Allergies  Allergen Reactions  . Penicillins Rash and Other (See Comments)    Childhood allergy  . Xylocaine [Lidocaine Hcl] Other (See Comments)    Muscles started contracting, jaws were grinding  . Avelox [Moxifloxacin Hcl In Nacl] Swelling and Rash  . Avelox [Moxifloxacin] Other (See Comments)    Reaction not recalled  . Barley Grass Itching  . Cabbage Other (See Comments)    Tested allergic  . Epinephrine Other (See Comments)    Jaws locked  . Polio Virus Vaccine Live Oral Trivalent Other (See Comments)    Blacked out  . Prednisone Other (See Comments)    Reaction??  . Achromycin [Tetracycline] Rash  . Aureomycin [Chlortetracycline] Other (See Comments)    Unknown reaction  . Cocaine Other (See Comments)    Childhood allergy for medicinal purposes  . Ilotycin [Erythromycin] Other (See Comments)    Childhood allergy   . Levaquin [Levofloxacin In D5w] Hives and Other (See Comments)    "Crazy dreams," also  . Lincomycin Other (See Comments)    Childhood allergy  . Other Other (See Comments)    Compenamine, small pox vaccine - Childhood allergy  . Thorazine [Chlorpromazine] Other (See Comments)    Unknown reaction      Home Medications  Prior to Admission medications   Medication Sig Start Date End Date Taking? Authorizing Provider  amLODipine-olmesartan (AZOR) 5-40 MG per tablet Take 1 tablet by mouth daily.   Yes [provider]  aspirin EC 81 MG tablet Take 81 mg by mouth daily.   Yes [provider]  atorvastatin (LIPITOR) 40 MG tablet Take 40 mg by mouth at bedtime. 04/15/20  Yes [provider]  Coenzyme Q10 (CO Q 10 PO) Take 1 capsule by mouth daily.    Yes  [provider]  fexofenadine-pseudoephedrine (ALLEGRA-D) 60-120 MG 12 hr tablet Take 1 tablet by mouth 2 (two) times daily as needed (for allergic reactions).   Yes [provider]  Flaxseed, Linseed, (FLAX SEED OIL PO) Take 1 capsule by mouth daily.    Yes [provider]  fluticasone (FLONASE) 50 MCG/ACT nasal spray Place 1-2 sprays into both nostrils daily as needed for allergies or rhinitis.   Yes [provider]  GARLIC PO Take 1 tablet by mouth daily.   Yes [provider]  HYDROcodone-acetaminophen (NORCO) 7.5-325 MG tablet Take 1 tablet by mouth 2 (two) times daily as needed (for knee pain).  04/15/20  Yes [provider]  Multiple Vitamins-Minerals (MULTIVITAMIN PO) Take 1 tablet by mouth daily.   Yes [provider]  Omega-3 Fatty Acids (FISH OIL PO) Take 1  capsule by mouth daily.    Yes [provider]     Critical care time: 32 min      Erick Colace ACNP-BC [place] Pager # [phone] OR # [phone] if no answer

## 2020-04-28 NOTE — Progress Notes (Addendum)
Despite 1u PRBC transfusion, HGB down to 7.7 this morning from 7.9.  Did get 1L NS and was on bicarb gtt over night so some of this may be dilutional, but suspect he may have ongoing GIB faster than initially suspected.  Just had BM per RN.  Ordering a second unit PRBC transfusion now and H/H post transfusion.

## 2020-04-28 NOTE — ED Notes (Signed)
Dr Grandville Silos aware Hgb has dropped to 5.6 post transfusion. At bedside to assess patient

## 2020-04-28 NOTE — Interval H&P Note (Signed)
History and Physical Interval Note:  04/28/2020 1:54 PM  Donald Kim  has presented today for surgery, with the diagnosis of anemia, heme-positive stools.  The various methods of treatment have been discussed with the patient and family. After consideration of risks, benefits and other options for treatment, the patient has consented to  Procedure(s): ESOPHAGOGASTRODUODENOSCOPY (EGD) (N/A) as a surgical intervention.  The patient's history has been reviewed, patient examined, no change in status, stable for surgery.  I have reviewed the patient's chart and labs.  Questions were answered to the patient's satisfaction.     [name]

## 2020-04-28 NOTE — Anesthesia Postprocedure Evaluation (Signed)
Anesthesia Post Note  Patient: Donald Kim  Procedure(s) Performed: ESOPHAGOGASTRODUODENOSCOPY (EGD) (N/A ) ESOPHAGEAL BRUSHING     Patient location during evaluation: Endoscopy Anesthesia Type: General Level of consciousness: awake and alert Pain management: pain level controlled Vital Signs Assessment: post-procedure vital signs reviewed and stable Respiratory status: spontaneous breathing, nonlabored ventilation and respiratory function stable Cardiovascular status: blood pressure returned to baseline and stable Postop Assessment: no apparent nausea or vomiting Anesthetic complications: no    Last Vitals:  Vitals:   04/28/20 1425 04/28/20 1426  BP: 123/82   Pulse: (!) 102   Resp: 20   Temp: 37.1 C 37.1 C  SpO2: 97%     Last Pain:  Vitals:   04/28/20 1426  TempSrc: Oral  PainSc:                  Enis Leatherwood,W. EDMOND

## 2020-04-28 NOTE — Consult Note (Addendum)
Referring Provider: Dr. Irine Seal Primary Care Physician:  Tonye Becket., MD Primary Gastroenterologist:  Althia Forts  Reason for Consultation: Iron deficiency anemia, heme-positive stools  HPI: Donald Kim is a 69 y.o. male with history of hypertension who presented to the ED after he was found to be anemic by his PCP.  Patient reports he has been feeling weak and fatigued for approximately 2 weeks.  Additionally, 1 to 2 weeks ago, he had a fever of 104F and felt chilled.  He initially thought he may have Covid but he tested negative.  He also had several days of diarrhea, but this resolved.  He reports recurrence of diarrhea over the last day or so.  Initially denied melena but now states he has had black "licorice" stools for the past couple of days, as well as a few weeks ago. Denies any hematochezia.  Patient denies any abdominal pain, nausea, vomiting, hematemesis, dysphagia, heartburn, changes in appetite, unexplained weight loss, or prior episodes of GI bleeding.  He denies any aspirin, NSAID, or blood thinner use.  He denies any family history of colon cancer or gastrointestinal malignancies.  He has never had an EGD or colonoscopy but states he has had a Cologuard a couple years ago, which was negative.  He states he has not yet due for his next Cologuard.  RUQ [place] 04/27/2020 did not show any evidence of cholelithiasis or cholecystitis.  Normal CBD (5 mm) without any dilation or signs of stones.  Hepatic steatosis was present.  CT 04/27/2020 showed left perinephric and periureteral fat stranding without hydronephrosis, as well as cholelithiasis without cholecystitis and diverticulosis without diverticulitis.  Additionally, there was a stricture along the left pelvic slide wall suspected to be an enlarged lymph node but etiology was indeterminate.  Past Medical History:  Diagnosis Date  . Hyperlipidemia   . Hypertension     Past Surgical History:  Procedure Laterality Date  .  ANKLE SURGERY    . KNEE SURGERY    . NOSE SURGERY      Prior to Admission medications   Medication Sig Start Date End Date Taking? Authorizing Provider  amLODipine-olmesartan (AZOR) 5-40 MG per tablet Take 1 tablet by mouth daily.   Yes [provider]  aspirin EC 81 MG tablet Take 81 mg by mouth daily.   Yes [provider]  atorvastatin (LIPITOR) 40 MG tablet Take 40 mg by mouth at bedtime. 04/15/20  Yes [provider]  Coenzyme Q10 (CO Q 10 PO) Take 1 capsule by mouth daily.    Yes [provider]  fexofenadine-pseudoephedrine (ALLEGRA-D) 60-120 MG 12 hr tablet Take 1 tablet by mouth 2 (two) times daily as needed (for allergic reactions).   Yes [provider]  Flaxseed, Linseed, (FLAX SEED OIL PO) Take 1 capsule by mouth daily.    Yes [provider]  fluticasone (FLONASE) 50 MCG/ACT nasal spray Place 1-2 sprays into both nostrils daily as needed for allergies or rhinitis.   Yes [provider]  GARLIC PO Take 1 tablet by mouth daily.   Yes [provider]  HYDROcodone-acetaminophen (NORCO) 7.5-325 MG tablet Take 1 tablet by mouth 2 (two) times daily as needed (for knee pain).  04/15/20  Yes [provider]  Multiple Vitamins-Minerals (MULTIVITAMIN PO) Take 1 tablet by mouth daily.   Yes [provider]  Omega-3 Fatty Acids (FISH OIL PO) Take 1 capsule by mouth daily.    Yes [provider]    Scheduled Meds: .  aspirin EC  81 mg Oral Daily  . atorvastatin  40 mg Oral QHS  . ferrous sulfate  325 mg Oral TID WC   Continuous Infusions: . sodium chloride Stopped (04/27/20 2123)  . cefTRIAXone (ROCEPHIN)  IV    .  sodium bicarbonate (isotonic) infusion in sterile water 125 mL/hr at 04/28/20 1010   PRN Meds:.acetaminophen **OR** acetaminophen, fluticasone, HYDROcodone-acetaminophen, ondansetron **OR** ondansetron (ZOFRAN) IV  Allergies as of 04/27/2020 - Review Complete 04/27/2020   Allergen Reaction Noted  . Penicillins Rash and Other (See Comments) 07/05/2014  . Xylocaine [lidocaine hcl] Other (See Comments) 07/05/2014  . Avelox [moxifloxacin hcl in nacl] Swelling and Rash 07/05/2014  . Avelox [moxifloxacin] Other (See Comments) 04/27/2020  . Barley grass Itching 04/27/2020  . Cabbage Other (See Comments) 04/27/2020  . Epinephrine Other (See Comments) 04/27/2020  . Polio virus vaccine live oral trivalent Other (See Comments) 04/27/2020  . Prednisone Other (See Comments) 04/27/2020  . Achromycin [tetracycline] Rash 07/05/2014  . Aureomycin [chlortetracycline] Other (See Comments) 07/05/2014  . Cocaine Other (See Comments) 07/05/2014  . Ilotycin [erythromycin] Other (See Comments) 07/05/2014  . Levaquin [levofloxacin in d5w] Hives and Other (See Comments) 07/05/2014  . Lincomycin Other (See Comments) 07/05/2014  . Other Other (See Comments) 07/05/2014  . Thorazine [chlorpromazine] Other (See Comments) 07/05/2014    No family history on file.  Social History   Socioeconomic History  . Marital status: Single    Spouse name: Not on file  . Number of children: Not on file  . Years of education: Not on file  . Highest education level: Not on file  Occupational History  . Not on file  Tobacco Use  . Smoking status: Never Smoker  Substance and Sexual Activity  . Alcohol use: Yes    Comment: "occassionally"   . Drug use: No  . Sexual activity: Not on file  Other Topics Concern  . Not on file  Social History Narrative  . Not on file   Social Determinants of Health   Financial Resource Strain:   . Difficulty of Paying Living Expenses:   Food Insecurity:   . Worried About Charity fundraiser in the Last Year:   . Arboriculturist in the Last Year:   Transportation Needs:   . Film/video editor (Medical):   Marland Kitchen Lack of Transportation (Non-Medical):   Physical Activity:   . Days of Exercise per Week:   . Minutes of Exercise per Session:   Stress:    . Feeling of Stress :   Social Connections:   . Frequency of Communication with Friends and Family:   . Frequency of Social Gatherings with Friends and Family:   . Attends Religious Services:   . Active Member of Clubs or Organizations:   . Attends Archivist Meetings:   Marland Kitchen Marital Status:   Intimate Partner Violence:   . Fear of Current or Ex-Partner:   . Emotionally Abused:   Marland Kitchen Physically Abused:   . Sexually Abused:     Review of Systems: Review of Systems  Constitutional: Positive for chills (one week ago), fever (one week ago) and malaise/fatigue. Negative for weight loss.  HENT: Negative for hearing loss and tinnitus.   Eyes: Negative for pain and redness.  Respiratory: Negative for cough and shortness of breath.   Cardiovascular: Negative for chest pain and palpitations.  Gastrointestinal: Positive for diarrhea. Negative for abdominal pain, blood in stool, constipation, heartburn, melena, nausea and vomiting.  Genitourinary: Negative for  flank pain and hematuria.  Musculoskeletal: Negative for falls and joint pain.  Skin: Negative for itching and rash.  Neurological: Negative for seizures and loss of consciousness.  Endo/Heme/Allergies: Negative for polydipsia. Does not bruise/bleed easily.  Psychiatric/Behavioral: Negative for substance abuse. The patient is not nervous/anxious.     Physical Exam: Vital signs: Vitals:   04/28/20 0734 04/28/20 1011  BP: 112/67 103/62  Pulse: (!) 107 (!) 106  Resp: (!) 21 (!) 22  Temp: 98.3 F (36.8 C) 98.7 F (37.1 C)  SpO2: 96% 98%     Physical Exam  Constitutional: He is oriented to person, place, and time. He appears well-developed and well-nourished. No distress.  obese  HENT:  Head: Normocephalic and atraumatic.  Eyes: EOM are normal. No scleral icterus.  Conjunctival pallor  Cardiovascular: Regular rhythm and normal heart sounds. Tachycardia present.  Pulmonary/Chest: Effort normal and breath sounds normal.  No respiratory distress.  Mild tachypnea  Abdominal: Soft. Bowel sounds are normal. He exhibits no distension and no mass. There is no abdominal tenderness. There is no rebound and no guarding.  Musculoskeletal:        General: No deformity or edema.     Cervical back: Normal range of motion and neck supple.  Neurological: He is alert and oriented to person, place, and time.  Skin: Skin is warm and dry.  Psychiatric: He has a normal mood and affect. His behavior is normal.   GI:  Lab Results: Recent Labs    04/27/20 1658 04/28/20 0229  WBC 16.2* 15.0*  HGB 7.9* 7.7*  HCT 25.6* 24.7*  PLT 460* 434*   BMET Recent Labs    04/27/20 1658 04/28/20 0229  NA 141 144  K 4.8 4.9  CL 115* 117*  CO2 15* 17*  GLUCOSE 164* 121*  BUN 100* 90*  CREATININE 3.55* 3.27*  CALCIUM 8.7* 8.5*   LFT Recent Labs    04/28/20 0229  PROT 6.2*  ALBUMIN 2.0*  AST 121*  ALT 192*  ALKPHOS 173*  BILITOT 1.1   PT/INR No results for input(s): LABPROT, INR in the last 72 hours.   Studies/Results: CT ABDOMEN PELVIS WO CONTRAST  Result Date: 04/27/2020 CLINICAL DATA:  Generalized weakness, blood in stool, elevated LFTs EXAM: CT ABDOMEN AND PELVIS WITHOUT CONTRAST TECHNIQUE: Multidetector CT imaging of the abdomen and pelvis was performed following the standard protocol without IV contrast. COMPARISON:  02/03/2016, 04/27/2020 FINDINGS: Lower chest: No acute pleural or parenchymal lung disease. Hepatobiliary: Calcified gallstones are identified without cholecystitis. Liver is unremarkable. Pancreas: Unremarkable. No pancreatic ductal dilatation or surrounding inflammatory changes. Spleen: Normal in size without focal abnormality. Adrenals/Urinary Tract: There is left perinephric and Peri ureteral fat stranding. I do not see any evidence of hydronephrosis. There is a punctate less than 2 mm nonobstructing calculus left kidney image 39. The appearance could reflect infection or recent passage of a  calculus. Please correlate with urinalysis. The right kidney is unremarkable. The adrenals are normal. Bladder is unremarkable. Stomach/Bowel: No bowel obstruction or ileus. Diverticulosis of the distal colon without diverticulitis. No wall thickening or inflammatory change. Normal appendix right lower quadrant. Vascular/Lymphatic: Aortic atherosclerosis. There is a 2.2 by 2.5 cm low-attenuation structure along the left pelvic sidewall, just posterior to the left external iliac vein, reference image 84/3. This could reflect an enlarged lymph node. No other pathologic adenopathy. Reproductive: Prostate is unremarkable. Other: No free fluid or free gas. Small fat containing umbilical hernia unchanged. Musculoskeletal: No acute or destructive bony lesions. Reconstructed  images demonstrate no additional findings. IMPRESSION: 1. Left perinephric and periureteral fat stranding, without hydronephrosis. The appearance could reflect infection or recent passage of a calculus. Please correlate with urinalysis. 2. Punctate less than 2 mm nonobstructing left renal calculus. 3. Cholelithiasis without cholecystitis. 4. Diverticulosis without diverticulitis. 5. A 2.2 by 2.5 cm low-attenuation structure along the left pelvic sidewall, just posterior to the left external iliac vein, likely an enlarged lymph node. Etiology indeterminate. Please correlate with any history of known malignancy. Short interval follow-up CT or PET-CT could be considered. 6. Aortic Atherosclerosis (ICD10-I70.0). Electronically Signed   By: Randa Ngo M.D.   On: 04/27/2020 21:27   US Abdomen Limited RUQ  Result Date: 04/27/2020 CLINICAL DATA:  Elevated LFTs EXAM: ULTRASOUND ABDOMEN LIMITED RIGHT UPPER QUADRANT COMPARISON:  None. FINDINGS: Gallbladder: No gallstones or wall thickening visualized. No sonographic Murphy sign noted by sonographer. Common bile duct: Diameter: 5 mm Liver: Diffuse increased echogenicity with slightly heterogeneous liver.  Appearance typically secondary to fatty infiltration. Fibrosis secondary consideration. No secondary findings of cirrhosis noted. No focal hepatic lesion or intrahepatic biliary duct dilatation. Portal vein is patent on color Doppler imaging with normal direction of blood flow towards the liver. Other: None. IMPRESSION: 1. No evidence for cholelithiasis or acute cholecystitis. 2. Hepatic steatosis. Electronically Signed   By: Constance Holster M.D.   On: 04/27/2020 20:59   Impression: Iron deficiency anemia -Hemoglobin was 7.9 on arrival.  He received 1 unit pRBCs (6/8 at 21:38) with insufficient rise in hemoglobin. Hemoglobin 7.7 today 02:29.  He was then given a second unit of pRBCs.  Posttransfusion H&H is pending.  No recent baseline CBC on file, though hemoglobin was 12.6-13.2 in March 2017. -FOBT positive, though no visible bleeding -Ferritin elevated to 684 but iron decreased to 16 and iron saturation decreased to 7%. Concerning for acute blood loss anemia.  Sepsis, presumed urosepsis -CT 04/27/2020 showed left perinephric and periureteral fat stranding without hydronephrosis -WBCs 15.0, decreased from 16.2 yesterday -UA showed cloudy urine with moderate leukocytes, rare bacteria -BUN 90/ Cr 3.27 -Hemoglobin was 7.9 on arrival.  He received 1 unit pRBCs (6/8 at 21:38) with insufficient rise in hemoglobin. Hemoglobin 7.7 today 02: 29.  He was then given a second unit of pRBCs.  Posttr  Transaminitis: likely shock liver in the setting of sepsis -T. Bili 1.1/ AST 121/ ALT 192/ ALP 173 -Acute hepatitis panel negative -CT showed cholelithiasis without cholecystitis; no CBD dilation on [place]  Plan: Patient is hemodynamically stable and not requiring any pressors.  He has been afebrile for approximately 1 week.  We will proceed with EGD and colonoscopy tomorrow.  I thoroughly discussed the procedures, nature, benefits, and risks (including but not limited to bleeding, infection, perforation,  anesthesia/cardiac and pulmonary complications) with the patient.  Patient verbalized understanding and gave verbal consent to proceed with the EGD and colonoscopy with Dr. Michail Sermon tomorrow.  Continue to monitor H&H with transfusion as needed to maintain Hgb >7 to 8.  Protonix 40mg  IV daily for now.   [facility] will follow.  Addendum: Hgb 5.6 after 2nd unit of pRBCs and patient now having black stools.  Recommend Protonix drip, 2 more units of pRBCs, and emergent EGD today.   LOS: 0 days   Salley Slaughter  PA-C 04/28/2020, 10:57 AM  Contact #  [phone]

## 2020-04-28 NOTE — ED Notes (Signed)
Dr. Alcario Drought admitting informed unable to collect blood cultures d/t blood transfusion.

## 2020-04-28 NOTE — H&P (View-Only) (Signed)
Referring Provider: Dr. Irine Seal Primary Care Physician:  Tonye Becket., MD Primary Gastroenterologist:  Althia Forts  Reason for Consultation: Iron deficiency anemia, heme-positive stools  HPI: Donald Kim is a 69 y.o. male with history of hypertension who presented to the ED after he was found to be anemic by his PCP.  Patient reports he has been feeling weak and fatigued for approximately 2 weeks.  Additionally, 1 to 2 weeks ago, he had a fever of 104F and felt chilled.  He initially thought he may have Covid but he tested negative.  He also had several days of diarrhea, but this resolved.  He reports recurrence of diarrhea over the last day or so.  Initially denied melena but now states he has had black "licorice" stools for the past couple of days, as well as a few weeks ago. Denies any hematochezia.  Patient denies any abdominal pain, nausea, vomiting, hematemesis, dysphagia, heartburn, changes in appetite, unexplained weight loss, or prior episodes of GI bleeding.  He denies any aspirin, NSAID, or blood thinner use.  He denies any family history of colon cancer or gastrointestinal malignancies.  He has never had an EGD or colonoscopy but states he has had a Cologuard a couple years ago, which was negative.  He states he has not yet due for his next Cologuard.  RUQ [place] 04/27/2020 did not show any evidence of cholelithiasis or cholecystitis.  Normal CBD (5 mm) without any dilation or signs of stones.  Hepatic steatosis was present.  CT 04/27/2020 showed left perinephric and periureteral fat stranding without hydronephrosis, as well as cholelithiasis without cholecystitis and diverticulosis without diverticulitis.  Additionally, there was a stricture along the left pelvic slide wall suspected to be an enlarged lymph node but etiology was indeterminate.  Past Medical History:  Diagnosis Date  . Hyperlipidemia   . Hypertension     Past Surgical History:  Procedure Laterality Date  .  ANKLE SURGERY    . KNEE SURGERY    . NOSE SURGERY      Prior to Admission medications   Medication Sig Start Date End Date Taking? Authorizing Provider  amLODipine-olmesartan (AZOR) 5-40 MG per tablet Take 1 tablet by mouth daily.   Yes [provider]  aspirin EC 81 MG tablet Take 81 mg by mouth daily.   Yes [provider]  atorvastatin (LIPITOR) 40 MG tablet Take 40 mg by mouth at bedtime. 04/15/20  Yes [provider]  Coenzyme Q10 (CO Q 10 PO) Take 1 capsule by mouth daily.    Yes [provider]  fexofenadine-pseudoephedrine (ALLEGRA-D) 60-120 MG 12 hr tablet Take 1 tablet by mouth 2 (two) times daily as needed (for allergic reactions).   Yes [provider]  Flaxseed, Linseed, (FLAX SEED OIL PO) Take 1 capsule by mouth daily.    Yes [provider]  fluticasone (FLONASE) 50 MCG/ACT nasal spray Place 1-2 sprays into both nostrils daily as needed for allergies or rhinitis.   Yes [provider]  GARLIC PO Take 1 tablet by mouth daily.   Yes [provider]  HYDROcodone-acetaminophen (NORCO) 7.5-325 MG tablet Take 1 tablet by mouth 2 (two) times daily as needed (for knee pain).  04/15/20  Yes [provider]  Multiple Vitamins-Minerals (MULTIVITAMIN PO) Take 1 tablet by mouth daily.   Yes [provider]  Omega-3 Fatty Acids (FISH OIL PO) Take 1 capsule by mouth daily.    Yes [provider]    Scheduled Meds: .  aspirin EC  81 mg Oral Daily  . atorvastatin  40 mg Oral QHS  . ferrous sulfate  325 mg Oral TID WC   Continuous Infusions: . sodium chloride Stopped (04/27/20 2123)  . cefTRIAXone (ROCEPHIN)  IV    .  sodium bicarbonate (isotonic) infusion in sterile water 125 mL/hr at 04/28/20 1010   PRN Meds:.acetaminophen **OR** acetaminophen, fluticasone, HYDROcodone-acetaminophen, ondansetron **OR** ondansetron (ZOFRAN) IV  Allergies as of 04/27/2020 - Review Complete 04/27/2020   Allergen Reaction Noted  . Penicillins Rash and Other (See Comments) 07/05/2014  . Xylocaine [lidocaine hcl] Other (See Comments) 07/05/2014  . Avelox [moxifloxacin hcl in nacl] Swelling and Rash 07/05/2014  . Avelox [moxifloxacin] Other (See Comments) 04/27/2020  . Barley grass Itching 04/27/2020  . Cabbage Other (See Comments) 04/27/2020  . Epinephrine Other (See Comments) 04/27/2020  . Polio virus vaccine live oral trivalent Other (See Comments) 04/27/2020  . Prednisone Other (See Comments) 04/27/2020  . Achromycin [tetracycline] Rash 07/05/2014  . Aureomycin [chlortetracycline] Other (See Comments) 07/05/2014  . Cocaine Other (See Comments) 07/05/2014  . Ilotycin [erythromycin] Other (See Comments) 07/05/2014  . Levaquin [levofloxacin in d5w] Hives and Other (See Comments) 07/05/2014  . Lincomycin Other (See Comments) 07/05/2014  . Other Other (See Comments) 07/05/2014  . Thorazine [chlorpromazine] Other (See Comments) 07/05/2014    No family history on file.  Social History   Socioeconomic History  . Marital status: Single    Spouse name: Not on file  . Number of children: Not on file  . Years of education: Not on file  . Highest education level: Not on file  Occupational History  . Not on file  Tobacco Use  . Smoking status: Never Smoker  Substance and Sexual Activity  . Alcohol use: Yes    Comment: "occassionally"   . Drug use: No  . Sexual activity: Not on file  Other Topics Concern  . Not on file  Social History Narrative  . Not on file   Social Determinants of Health   Financial Resource Strain:   . Difficulty of Paying Living Expenses:   Food Insecurity:   . Worried About Charity fundraiser in the Last Year:   . Arboriculturist in the Last Year:   Transportation Needs:   . Film/video editor (Medical):   Marland Kitchen Lack of Transportation (Non-Medical):   Physical Activity:   . Days of Exercise per Week:   . Minutes of Exercise per Session:   Stress:    . Feeling of Stress :   Social Connections:   . Frequency of Communication with Friends and Family:   . Frequency of Social Gatherings with Friends and Family:   . Attends Religious Services:   . Active Member of Clubs or Organizations:   . Attends Archivist Meetings:   Marland Kitchen Marital Status:   Intimate Partner Violence:   . Fear of Current or Ex-Partner:   . Emotionally Abused:   Marland Kitchen Physically Abused:   . Sexually Abused:     Review of Systems: Review of Systems  Constitutional: Positive for chills (one week ago), fever (one week ago) and malaise/fatigue. Negative for weight loss.  HENT: Negative for hearing loss and tinnitus.   Eyes: Negative for pain and redness.  Respiratory: Negative for cough and shortness of breath.   Cardiovascular: Negative for chest pain and palpitations.  Gastrointestinal: Positive for diarrhea. Negative for abdominal pain, blood in stool, constipation, heartburn, melena, nausea and vomiting.  Genitourinary: Negative for  flank pain and hematuria.  Musculoskeletal: Negative for falls and joint pain.  Skin: Negative for itching and rash.  Neurological: Negative for seizures and loss of consciousness.  Endo/Heme/Allergies: Negative for polydipsia. Does not bruise/bleed easily.  Psychiatric/Behavioral: Negative for substance abuse. The patient is not nervous/anxious.     Physical Exam: Vital signs: Vitals:   04/28/20 0734 04/28/20 1011  BP: 112/67 103/62  Pulse: (!) 107 (!) 106  Resp: (!) 21 (!) 22  Temp: 98.3 F (36.8 C) 98.7 F (37.1 C)  SpO2: 96% 98%     Physical Exam  Constitutional: He is oriented to person, place, and time. He appears well-developed and well-nourished. No distress.  obese  HENT:  Head: Normocephalic and atraumatic.  Eyes: EOM are normal. No scleral icterus.  Conjunctival pallor  Cardiovascular: Regular rhythm and normal heart sounds. Tachycardia present.  Pulmonary/Chest: Effort normal and breath sounds normal.  No respiratory distress.  Mild tachypnea  Abdominal: Soft. Bowel sounds are normal. He exhibits no distension and no mass. There is no abdominal tenderness. There is no rebound and no guarding.  Musculoskeletal:        General: No deformity or edema.     Cervical back: Normal range of motion and neck supple.  Neurological: He is alert and oriented to person, place, and time.  Skin: Skin is warm and dry.  Psychiatric: He has a normal mood and affect. His behavior is normal.   GI:  Lab Results: Recent Labs    04/27/20 1658 04/28/20 0229  WBC 16.2* 15.0*  HGB 7.9* 7.7*  HCT 25.6* 24.7*  PLT 460* 434*   BMET Recent Labs    04/27/20 1658 04/28/20 0229  NA 141 144  K 4.8 4.9  CL 115* 117*  CO2 15* 17*  GLUCOSE 164* 121*  BUN 100* 90*  CREATININE 3.55* 3.27*  CALCIUM 8.7* 8.5*   LFT Recent Labs    04/28/20 0229  PROT 6.2*  ALBUMIN 2.0*  AST 121*  ALT 192*  ALKPHOS 173*  BILITOT 1.1   PT/INR No results for input(s): LABPROT, INR in the last 72 hours.   Studies/Results: CT ABDOMEN PELVIS WO CONTRAST  Result Date: 04/27/2020 CLINICAL DATA:  Generalized weakness, blood in stool, elevated LFTs EXAM: CT ABDOMEN AND PELVIS WITHOUT CONTRAST TECHNIQUE: Multidetector CT imaging of the abdomen and pelvis was performed following the standard protocol without IV contrast. COMPARISON:  02/03/2016, 04/27/2020 FINDINGS: Lower chest: No acute pleural or parenchymal lung disease. Hepatobiliary: Calcified gallstones are identified without cholecystitis. Liver is unremarkable. Pancreas: Unremarkable. No pancreatic ductal dilatation or surrounding inflammatory changes. Spleen: Normal in size without focal abnormality. Adrenals/Urinary Tract: There is left perinephric and Peri ureteral fat stranding. I do not see any evidence of hydronephrosis. There is a punctate less than 2 mm nonobstructing calculus left kidney image 39. The appearance could reflect infection or recent passage of a  calculus. Please correlate with urinalysis. The right kidney is unremarkable. The adrenals are normal. Bladder is unremarkable. Stomach/Bowel: No bowel obstruction or ileus. Diverticulosis of the distal colon without diverticulitis. No wall thickening or inflammatory change. Normal appendix right lower quadrant. Vascular/Lymphatic: Aortic atherosclerosis. There is a 2.2 by 2.5 cm low-attenuation structure along the left pelvic sidewall, just posterior to the left external iliac vein, reference image 84/3. This could reflect an enlarged lymph node. No other pathologic adenopathy. Reproductive: Prostate is unremarkable. Other: No free fluid or free gas. Small fat containing umbilical hernia unchanged. Musculoskeletal: No acute or destructive bony lesions. Reconstructed  images demonstrate no additional findings. IMPRESSION: 1. Left perinephric and periureteral fat stranding, without hydronephrosis. The appearance could reflect infection or recent passage of a calculus. Please correlate with urinalysis. 2. Punctate less than 2 mm nonobstructing left renal calculus. 3. Cholelithiasis without cholecystitis. 4. Diverticulosis without diverticulitis. 5. A 2.2 by 2.5 cm low-attenuation structure along the left pelvic sidewall, just posterior to the left external iliac vein, likely an enlarged lymph node. Etiology indeterminate. Please correlate with any history of known malignancy. Short interval follow-up CT or PET-CT could be considered. 6. Aortic Atherosclerosis (ICD10-I70.0). Electronically Signed   By: Randa Ngo M.D.   On: 04/27/2020 21:27   US Abdomen Limited RUQ  Result Date: 04/27/2020 CLINICAL DATA:  Elevated LFTs EXAM: ULTRASOUND ABDOMEN LIMITED RIGHT UPPER QUADRANT COMPARISON:  None. FINDINGS: Gallbladder: No gallstones or wall thickening visualized. No sonographic Murphy sign noted by sonographer. Common bile duct: Diameter: 5 mm Liver: Diffuse increased echogenicity with slightly heterogeneous liver.  Appearance typically secondary to fatty infiltration. Fibrosis secondary consideration. No secondary findings of cirrhosis noted. No focal hepatic lesion or intrahepatic biliary duct dilatation. Portal vein is patent on color Doppler imaging with normal direction of blood flow towards the liver. Other: None. IMPRESSION: 1. No evidence for cholelithiasis or acute cholecystitis. 2. Hepatic steatosis. Electronically Signed   By: Constance Holster M.D.   On: 04/27/2020 20:59   Impression: Iron deficiency anemia -Hemoglobin was 7.9 on arrival.  He received 1 unit pRBCs (6/8 at 21:38) with insufficient rise in hemoglobin. Hemoglobin 7.7 today 02:29.  He was then given a second unit of pRBCs.  Posttransfusion H&H is pending.  No recent baseline CBC on file, though hemoglobin was 12.6-13.2 in March 2017. -FOBT positive, though no visible bleeding -Ferritin elevated to 684 but iron decreased to 16 and iron saturation decreased to 7%. Concerning for acute blood loss anemia.  Sepsis, presumed urosepsis -CT 04/27/2020 showed left perinephric and periureteral fat stranding without hydronephrosis -WBCs 15.0, decreased from 16.2 yesterday -UA showed cloudy urine with moderate leukocytes, rare bacteria -BUN 90/ Cr 3.27 -Hemoglobin was 7.9 on arrival.  He received 1 unit pRBCs (6/8 at 21:38) with insufficient rise in hemoglobin. Hemoglobin 7.7 today 02: 29.  He was then given a second unit of pRBCs.  Posttr  Transaminitis: likely shock liver in the setting of sepsis -T. Bili 1.1/ AST 121/ ALT 192/ ALP 173 -Acute hepatitis panel negative -CT showed cholelithiasis without cholecystitis; no CBD dilation on [place]  Plan: Patient is hemodynamically stable and not requiring any pressors.  He has been afebrile for approximately 1 week.  We will proceed with EGD and colonoscopy tomorrow.  I thoroughly discussed the procedures, nature, benefits, and risks (including but not limited to bleeding, infection, perforation,  anesthesia/cardiac and pulmonary complications) with the patient.  Patient verbalized understanding and gave verbal consent to proceed with the EGD and colonoscopy with Dr. Michail Sermon tomorrow.  Continue to monitor H&H with transfusion as needed to maintain Hgb >7 to 8.  Protonix 40mg  IV daily for now.   [facility] will follow.  Addendum: Hgb 5.6 after 2nd unit of pRBCs and patient now having black stools.  Recommend Protonix drip, 2 more units of pRBCs, and emergent EGD today.   LOS: 0 days   Salley Slaughter  PA-C 04/28/2020, 10:57 AM  Contact #  [phone]

## 2020-04-28 NOTE — Anesthesia Preprocedure Evaluation (Addendum)
Anesthesia Evaluation  Patient identified by MRN, date of birth, ID band Patient awake    Reviewed: Allergy & Precautions, H&P , NPO status , Patient's Chart, lab work & pertinent test results  Airway Mallampati: III  TM Distance: >3 FB Neck ROM: Full    Dental no notable dental hx. (+) Teeth Intact, Dental Advisory Given   Pulmonary neg pulmonary ROS,    Pulmonary exam normal breath sounds clear to auscultation       Cardiovascular hypertension, Pt. on medications  Rhythm:Regular Rate:Normal     Neuro/Psych negative neurological ROS  negative psych ROS   GI/Hepatic negative GI ROS, Neg liver ROS,   Endo/Other  Morbid obesity  Renal/GU negative Renal ROS  negative genitourinary   Musculoskeletal   Abdominal   Peds  Hematology  (+) Blood dyscrasia, anemia ,   Anesthesia Other Findings   Reproductive/Obstetrics negative OB ROS                            Anesthesia Physical Anesthesia Plan  ASA: III and emergent  Anesthesia Plan: General   Post-op Pain Management:    Induction: Intravenous, Rapid sequence and Cricoid pressure planned  PONV Risk Score and Plan: 2 and Ondansetron and Dexamethasone  Airway Management Planned: Oral ETT  Additional Equipment:   Intra-op Plan:   Post-operative Plan: Extubation in OR and Possible Post-op intubation/ventilation  Informed Consent: I have reviewed the patients History and Physical, chart, labs and discussed the procedure including the risks, benefits and alternatives for the proposed anesthesia with the patient or authorized representative who has indicated his/her understanding and acceptance.     Dental advisory given  Plan Discussed with: CRNA  Anesthesia Plan Comments:         Anesthesia Quick Evaluation

## 2020-04-28 NOTE — Transfer of Care (Signed)
Immediate Anesthesia Transfer of Care Note  Patient: Donald Kim  Procedure(s) Performed: ESOPHAGOGASTRODUODENOSCOPY (EGD) (N/A )  Patient Location: Endoscopy Unit  Anesthesia Type:General  Level of Consciousness: awake and alert   Airway & Oxygen Therapy: Patient Spontanous Breathing  Post-op Assessment: Report given to RN  Post vital signs: Reviewed and stable  Last Vitals:  Vitals Value Taken Time  BP    Temp    Pulse    Resp    SpO2      Last Pain:  Vitals:   04/28/20 1331  TempSrc: Oral  PainSc: 0-No pain         Complications: No apparent anesthesia complications

## 2020-04-28 NOTE — ED Notes (Signed)
Report given to Frederik Pear RN. Aware patient is in Endo and will transported there after procedure. All belongings taken to Endo with patient.

## 2020-04-28 NOTE — Progress Notes (Signed)
PROGRESS NOTE    Donald Kim  WLN:[id number] DOB: November 24, 1950 DOA: 04/27/2020 PCP: Tonye Becket., MD    Chief Complaint  Patient presents with  . Weakness  . Blood In Stools    Brief Narrative:  HPI per Dr. Mylo Red Preece is a 69 y.o. male with medical history significant of HTN, HLD.  Pt sent in to ED from PCP for lab work which demonstrates a new anemia.  Pt has had progressively worsening generalized weakness and fatigue over past 2 weeks.  2 weeks ago he developed fevers to 104 for about 4 days.  COVID was neg.  Had associated steatorrhea and diarrhea with this.  Some nausea, no vomiting.  No abd pain, no flank pain, no dysuria.  Diarrhea resolved, weakness persisted.  Pt went to PCP today.  PCP drew labs, sent to ED for evaluation of anemia.   ED Course: HGB 7.9, Creat 3.55 up from 1.7 in 2017, BUN 100, afebrile in ED.  HR 100-110 sinus.  SBP 93-114.  WBC 16.2k, AST 129 ALT 210.  UA with mod blood, mod LE, 6-10 wbs, 0 RBC.  Bicarb 15, AG 11.  Iron 16.  CT abd/pelvis results as noted below in note.   Assessment & Plan:   Principal Problem:   Pyelonephritis of left kidney Active Problems:   Acute-on-chronic kidney injury (HCC)   Transaminitis   Diarrhea   Positive fecal occult blood test   Iron deficiency anemia   Metabolic acidosis with normal anion gap and bicarbonate losses   GI bleed  1 GI bleed/iron deficiency anemia/positive heme stools CT abdomen and pelvis which was done was concerning for lymph node noted.  CT abdomen and pelvis also done concerning for possible pyelonephritis, 2 mm nonobstructing left renal calculus, cholelithiasis without cholecystitis.  Patient noted on presentation to present with generalized weakness.  Hemoglobin on admission noted to be 7.9.  Patient received a unit of packed red blood cells and hemoglobin dropped further to 7.7.  Patient received another unit of packed red blood cells hemoglobin dropped to 5.6.   2 units of packed red blood cells have been ordered to be transfused stat.  Patient did state has been having some intermittent black watery stools that he describes as licorice.  Patient denies any NSAID use.  No prior history of peptic ulcer disease.  Due to significant drop in hemoglobin after 2 units of packed red blood cells down to 5.6 GI was consulted for emergent evaluation with endoscopy.  Patient to be transfused 2 units packed red blood cells.  Will change bed assignment to ICU and consult with critical care as patient will need to be admitted to the ICU for close monitoring due to concerns for acute decompensation.  Placed on a Protonix drip.  N.p.o.  GI and PCCM following and appreciate their input and recommendations.  2.  Recent febrile illness/??  Acute pyelonephritis Concern for acute pyelonephritis noted on CT scan.  Urinalysis done on admission with concerns for UTI.  Patient received IV fluids in the ED.  Patient has been pancultured results pending.  Continue empiric IV Rocephin.  Supportive care.  3.  Acute kidney injury on chronic kidney disease stage III/non-anion gap metabolic acidosis Baseline creatinine approximately 1.7.  Likely secondary to a prerenal azotemia in the setting of ARB.  ARB on hold.  CT abdomen and pelvis which was done was negative for hydronephrosis.  Patient being hydrated with IV fluids and also being transfused packed red blood cells.  Follow renal function.  Check urine sodium.  Check a urine creatinine.  Urinalysis with 30 protein.  If no improvement in renal function and worsening will check a renal ultrasound and consult with nephrology.  4.  Transaminitis Questionable etiology.  LFTs slowly trending down.  Acute hepatitis panel negative.  CT abdomen and pelvis with cholelithiasis without cholecystitis, liver noted to be unremarkable.  Monitor with hydration.  GI consulted and are following.  5.  Hypertension Continue to hold antihypertensive  medications.   DVT prophylaxis: SCDs Code Status: Full Family Communication: Updated patient.  No family at bedside. Disposition:   Status is: Inpatient    Dispo: The patient is from: Home              Anticipated d/c is to: Home              Anticipated d/c date is: To be determined.              Patient currently not medically stable.  Patient status post 2 units packed red blood cells with hemoglobin dropping down to 5.6 from 7.9 on admission.  Patient going to upper endoscopy emergently.  Patient will be transferred to the ICU due to concern for high risk for decompensation.       Consultants:   Gastroenterology: Dr. Michail Sermon 04/28/2020  PCCM pending  Procedures:   Upper endoscopy pending 04/28/2020  Transfusion of 2 units packed red blood cells 04/28/2020  Transfusion of 2 units packed red blood cells pending 04/28/2020  CT abdomen and pelvis 04/27/2020  Antimicrobials:   IV Rocephin 04/27/2020>>>>   Subjective: Patient sleeping but arousable.  Patient denies any chest pain.  No shortness of breath.  Still with some significant weakness.  States he has been having watery diarrhea that he describes as licorice/black in nature ongoing intermittently for the past 2 weeks.  Patient stated had a bout of this black watery stool early on this morning in the ED.  Patient denies any flank pain.  No dysuria.  Objective: Vitals:   04/28/20 1100 04/28/20 1130 04/28/20 1200 04/28/20 1230  BP: 115/67 (!) 105/56 122/75 111/77  Pulse: (!) 101 (!) 103 97 92  Resp: (!) 23 17 (!) 21 17  Temp:      TempSrc:      SpO2: 96% 92% 95% 96%  Weight:      Height:        Intake/Output Summary (Last 24 hours) at 04/28/2020 1321 Last data filed at 04/28/2020 1114 Gross per 24 hour  Intake 1050 ml  Output 520 ml  Net 530 ml   Filed Weights   04/27/20 1631  Weight: 124.7 kg    Examination:  General exam: Appears calm and comfortable  Respiratory system: Clear to auscultation. Respiratory  effort normal. Cardiovascular system: S1 & S2 heard, RRR. No JVD, murmurs, rubs, gallops or clicks. No pedal edema. Gastrointestinal system: Abdomen is nondistended, soft and nontender. No organomegaly or masses felt. Normal bowel sounds heard. Central nervous system: Alert and oriented. No focal neurological deficits. Extremities: Symmetric 5 x 5 power. Skin: No rashes, lesions or ulcers Psychiatry: Judgement and insight appear normal. Mood & affect appropriate.     Data Reviewed: I have personally reviewed following labs and imaging studies  CBC: Recent Labs  Lab 04/27/20 1658 04/28/20 0229 04/28/20 1115  WBC 16.2* 15.0*  --   HGB 7.9* 7.7* 5.6*  HCT 25.6* 24.7* 16.7*  MCV 97.7 96.5  --   PLT 460* 434*  --  Basic Metabolic Panel: Recent Labs  Lab 04/27/20 1658 04/28/20 0229  NA 141 144  K 4.8 4.9  CL 115* 117*  CO2 15* 17*  GLUCOSE 164* 121*  BUN 100* 90*  CREATININE 3.55* 3.27*  CALCIUM 8.7* 8.5*    GFR: Estimated Creatinine Clearance: 27.5 mL/min (A) (by C-G formula based on SCr of 3.27 mg/dL (H)).  Liver Function Tests: Recent Labs  Lab 04/27/20 1658 04/28/20 0229  AST 129* 121*  ALT 210* 192*  ALKPHOS 184* 173*  BILITOT 0.6 1.1  PROT 6.7 6.2*  ALBUMIN 2.2* 2.0*    CBG: No results for input(s): GLUCAP in the last 168 hours.   Recent Results (from the past 240 hour(s))  SARS Coronavirus 2 by RT PCR (hospital order, performed in [facility] hospital lab) Nasopharyngeal Nasopharyngeal Swab     Status: None   Collection Time: 04/27/20 10:01 PM   Specimen: Nasopharyngeal Swab  Result Value Ref Range Status   SARS Coronavirus 2 NEGATIVE NEGATIVE Final    Comment: (NOTE) SARS-CoV-2 target nucleic acids are NOT DETECTED. The SARS-CoV-2 RNA is generally detectable in upper and lower respiratory specimens during the acute phase of infection. The lowest concentration of SARS-CoV-2 viral copies this assay can detect is 250 copies / mL. A negative  result does not preclude SARS-CoV-2 infection and should not be used as the sole basis for treatment or other patient management decisions.  A negative result may occur with improper specimen collection / handling, submission of specimen other than nasopharyngeal swab, presence of viral mutation(s) within the areas targeted by this assay, and inadequate number of viral copies (<250 copies / mL). A negative result must be combined with clinical observations, patient history, and epidemiological information. Fact Sheet for Patients:   StrictlyIdeas.no Fact Sheet for Healthcare Providers: BankingDealers.co.za This test is not yet approved or cleared  by the [place] FDA and has been authorized for detection and/or diagnosis of SARS-CoV-2 by FDA under an Emergency Use Authorization (EUA).  This EUA will remain in effect (meaning this test can be used) for the duration of the COVID-19 declaration under Section 564(b)(1) of the Act, 21 U.S.C. section 360bbb-3(b)(1), unless the authorization is terminated or revoked sooner. Performed at [facility], [place] [address] [place], [place] [address]          Radiology Studies: CT ABDOMEN PELVIS WO CONTRAST  Result Date: 04/27/2020 CLINICAL DATA:  Generalized weakness, blood in stool, elevated LFTs EXAM: CT ABDOMEN AND PELVIS WITHOUT CONTRAST TECHNIQUE: Multidetector CT imaging of the abdomen and pelvis was performed following the standard protocol without IV contrast. COMPARISON:  02/03/2016, 04/27/2020 FINDINGS: Lower chest: No acute pleural or parenchymal lung disease. Hepatobiliary: Calcified gallstones are identified without cholecystitis. Liver is unremarkable. Pancreas: Unremarkable. No pancreatic ductal dilatation or surrounding inflammatory changes. Spleen: Normal in size without focal abnormality. Adrenals/Urinary Tract: There is left perinephric and Peri ureteral fat stranding. I do  not see any evidence of hydronephrosis. There is a punctate less than 2 mm nonobstructing calculus left kidney image 39. The appearance could reflect infection or recent passage of a calculus. Please correlate with urinalysis. The right kidney is unremarkable. The adrenals are normal. Bladder is unremarkable. Stomach/Bowel: No bowel obstruction or ileus. Diverticulosis of the distal colon without diverticulitis. No wall thickening or inflammatory change. Normal appendix right lower quadrant. Vascular/Lymphatic: Aortic atherosclerosis. There is a 2.2 by 2.5 cm low-attenuation structure along the left pelvic sidewall, just posterior to the left external iliac vein, reference  image 84/3. This could reflect an enlarged lymph node. No other pathologic adenopathy. Reproductive: Prostate is unremarkable. Other: No free fluid or free gas. Small fat containing umbilical hernia unchanged. Musculoskeletal: No acute or destructive bony lesions. Reconstructed images demonstrate no additional findings. IMPRESSION: 1. Left perinephric and periureteral fat stranding, without hydronephrosis. The appearance could reflect infection or recent passage of a calculus. Please correlate with urinalysis. 2. Punctate less than 2 mm nonobstructing left renal calculus. 3. Cholelithiasis without cholecystitis. 4. Diverticulosis without diverticulitis. 5. A 2.2 by 2.5 cm low-attenuation structure along the left pelvic sidewall, just posterior to the left external iliac vein, likely an enlarged lymph node. Etiology indeterminate. Please correlate with any history of known malignancy. Short interval follow-up CT or PET-CT could be considered. 6. Aortic Atherosclerosis (ICD10-I70.0). Electronically Signed   By: Randa Ngo M.D.   On: 04/27/2020 21:27   US Abdomen Limited RUQ  Result Date: 04/27/2020 CLINICAL DATA:  Elevated LFTs EXAM: ULTRASOUND ABDOMEN LIMITED RIGHT UPPER QUADRANT COMPARISON:  None. FINDINGS: Gallbladder: No gallstones or  wall thickening visualized. No sonographic Murphy sign noted by sonographer. Common bile duct: Diameter: 5 mm Liver: Diffuse increased echogenicity with slightly heterogeneous liver. Appearance typically secondary to fatty infiltration. Fibrosis secondary consideration. No secondary findings of cirrhosis noted. No focal hepatic lesion or intrahepatic biliary duct dilatation. Portal vein is patent on color Doppler imaging with normal direction of blood flow towards the liver. Other: None. IMPRESSION: 1. No evidence for cholelithiasis or acute cholecystitis. 2. Hepatic steatosis. Electronically Signed   By: Constance Holster M.D.   On: 04/27/2020 20:59        Scheduled Meds: . sodium chloride   Intravenous Once  . acetaminophen  650 mg Oral Once  . aspirin EC  81 mg Oral Daily  . atorvastatin  40 mg Oral QHS  . diphenhydrAMINE  25 mg Oral Once  . ferrous sulfate  325 mg Oral TID WC  . furosemide  20 mg Intravenous Once  . [START ON 05/02/2020] pantoprazole  40 mg Intravenous Q12H  . polyethylene glycol-electrolytes  4,000 mL Oral Once   Continuous Infusions: . sodium chloride Stopped (04/27/20 2123)  . cefTRIAXone (ROCEPHIN)  IV    . pantoprozole (PROTONIX) infusion    . pantoprazole (PROTONIX) IVPB    .  sodium bicarbonate (isotonic) infusion in sterile water 125 mL/hr at 04/28/20 1010     LOS: 0 days    Time spent: 45 minutes    Irine Seal, MD [facility]   To contact the attending provider between 7A-7P or the covering provider during after hours 7P-7A, please log into the web site [email or web address] and access using universal [place] password for that web site. If you do not have the password, please call the hospital operator.  04/28/2020, 1:21 PM

## 2020-04-28 NOTE — Anesthesia Procedure Notes (Addendum)
Procedure Name: Intubation Date/Time: 04/28/2020 1:58 PM Performed by: Barrington Ellison, CRNA Pre-anesthesia Checklist: Patient identified, Emergency Drugs available, Suction available and Patient being monitored Patient Re-evaluated:Patient Re-evaluated prior to induction Oxygen Delivery Method: Circle System Utilized Preoxygenation: Pre-oxygenation with 100% oxygen Induction Type: IV induction, Rapid sequence and Cricoid Pressure applied Ventilation: Mask ventilation without difficulty and Two handed mask ventilation required Laryngoscope Size: Glidescope and 4 Grade View: Grade I Tube type: Oral Tube size: 7.5 mm Number of attempts: 1 Airway Equipment and Method: Stylet and Oral airway Placement Confirmation: ETT inserted through vocal cords under direct vision,  positive ETCO2 and breath sounds checked- equal and bilateral Secured at: 22 cm Tube secured with: Tape Dental Injury: Teeth and Oropharynx as per pre-operative assessment  Difficulty Due To: Difficulty was anticipated Comments: RSI d/t emergency, BUT DL would have been difficult and recommend future Glidescope intubations

## 2020-04-28 NOTE — ED Notes (Signed)
Ordered breakfast--Donald Kim 

## 2020-04-29 ENCOUNTER — Encounter (HOSPITAL_COMMUNITY): Payer: Self-pay | Admitting: Gastroenterology

## 2020-04-29 DIAGNOSIS — D649 Anemia, unspecified: Secondary | ICD-10-CM

## 2020-04-29 DIAGNOSIS — B3781 Candidal esophagitis: Secondary | ICD-10-CM | POA: Diagnosis present

## 2020-04-29 DIAGNOSIS — B9620 Unspecified Escherichia coli [E. coli] as the cause of diseases classified elsewhere: Secondary | ICD-10-CM

## 2020-04-29 LAB — TYPE AND SCREEN
ABO/RH(D): A POS
Antibody Screen: NEGATIVE
Unit division: 0
Unit division: 0
Unit division: 0
Unit division: 0

## 2020-04-29 LAB — BPAM RBC
Blood Product Expiration Date: 202106102359
Blood Product Expiration Date: 202106122359
Blood Product Expiration Date: 202106142359
Blood Product Expiration Date: 202106282359
ISSUE DATE / TIME: 202106082130
ISSUE DATE / TIME: 202106090703
ISSUE DATE / TIME: 202106091322
ISSUE DATE / TIME: 202106091322
Unit Type and Rh: 600
Unit Type and Rh: 600
Unit Type and Rh: 6200
Unit Type and Rh: 6200

## 2020-04-29 LAB — BASIC METABOLIC PANEL
Anion gap: 11 (ref 5–15)
BUN: 64 mg/dL — ABNORMAL HIGH (ref 8–23)
CO2: 24 mmol/L (ref 22–32)
Calcium: 8 mg/dL — ABNORMAL LOW (ref 8.9–10.3)
Chloride: 108 mmol/L (ref 98–111)
Creatinine, Ser: 2.88 mg/dL — ABNORMAL HIGH (ref 0.61–1.24)
GFR calc Af Amer: 24 mL/min — ABNORMAL LOW (ref >60)
GFR calc non Af Amer: 21 mL/min — ABNORMAL LOW (ref >60)
Glucose, Bld: 118 mg/dL — ABNORMAL HIGH (ref 70–99)
Potassium: 4.6 mmol/L (ref 3.5–5.1)
Sodium: 143 mmol/L (ref 135–145)

## 2020-04-29 LAB — CBC
HCT: 30.2 % — ABNORMAL LOW (ref 39.0–52.0)
Hemoglobin: 9.8 g/dL — ABNORMAL LOW (ref 13.0–17.0)
MCH: 30.2 pg (ref 26.0–34.0)
MCHC: 32.5 g/dL (ref 30.0–36.0)
MCV: 92.9 fL (ref 80.0–100.0)
Platelets: 400 10*3/uL (ref 150–400)
RBC: 3.25 MIL/uL — ABNORMAL LOW (ref 4.22–5.81)
RDW: 14.9 % (ref 11.5–15.5)
WBC: 13.9 10*3/uL — ABNORMAL HIGH (ref 4.0–10.5)
nRBC: 0 % (ref 0.0–0.2)

## 2020-04-29 LAB — URINE CULTURE: Culture: >=100000 — AB

## 2020-04-29 LAB — HEMOGLOBIN AND HEMATOCRIT, BLOOD
HCT: 29.5 % — ABNORMAL LOW (ref 39.0–52.0)
HCT: 28.6 % — ABNORMAL LOW (ref 39.0–52.0)
HCT: 30.6 % — ABNORMAL LOW (ref 39.0–52.0)
HCT: 30.9 % — ABNORMAL LOW (ref 39.0–52.0)
Hemoglobin: 9.7 g/dL — ABNORMAL LOW (ref 13.0–17.0)
Hemoglobin: 9.4 g/dL — ABNORMAL LOW (ref 13.0–17.0)
Hemoglobin: 9.8 g/dL — ABNORMAL LOW (ref 13.0–17.0)
Hemoglobin: 10.2 g/dL — ABNORMAL LOW (ref 13.0–17.0)

## 2020-04-29 LAB — HEPATIC FUNCTION PANEL
ALT: 142 U/L — ABNORMAL HIGH (ref 0–44)
AST: 85 U/L — ABNORMAL HIGH (ref 15–41)
Albumin: 1.8 g/dL — ABNORMAL LOW (ref 3.5–5.0)
Alkaline Phosphatase: 163 U/L — ABNORMAL HIGH (ref 38–126)
Bilirubin, Direct: 0.3 mg/dL — ABNORMAL HIGH (ref 0.0–0.2)
Indirect Bilirubin: 0.5 mg/dL (ref 0.3–0.9)
Total Bilirubin: 0.8 mg/dL (ref 0.3–1.2)
Total Protein: 5.6 g/dL — ABNORMAL LOW (ref 6.5–8.1)

## 2020-04-29 LAB — CYTOLOGY - NON PAP

## 2020-04-29 LAB — MAGNESIUM: Magnesium: 1.6 mg/dL — ABNORMAL LOW (ref 1.7–2.4)

## 2020-04-29 LAB — SODIUM, URINE, RANDOM: Sodium, Ur: 46 mmol/L

## 2020-04-29 MED ORDER — LACTATED RINGERS IV BOLUS: 500.0000 mL | Freq: Once | INTRAVENOUS | Status: DC

## 2020-04-29 MED ORDER — MAGNESIUM SULFATE 4 GM/100ML IV SOLN
4.0000 g | Freq: Once | INTRAVENOUS | Status: AC
  Administered 2020-04-29: 4 g via INTRAVENOUS
  Filled 2020-04-29: qty 100

## 2020-04-29 MED ORDER — SODIUM CHLORIDE 0.9 % IV SOLN: INTRAVENOUS | Status: DC

## 2020-04-29 NOTE — Progress Notes (Signed)
Report called to 5W for transfer. Pt will be transferred in bed due to ongoing bowel prep.

## 2020-04-29 NOTE — Progress Notes (Signed)
eLink Physician-Brief Progress Note Patient Name: Donald Kim DOB: 1951-02-12 MRN: [id number]   Date of Service  04/29/2020  HPI/Events of Note  Hypotension.  eICU Interventions  LR 500 ml iv fluid bolus, will check H & H.        Kerry Kass Sharen Youngren 04/29/2020, 3:58 AM

## 2020-04-29 NOTE — H&P (View-Only) (Signed)
[facility] Gastroenterology Progress Note  Donald Kim 69 y.o. 04-04-1951  CC:  Iron deficiency anemia, melena  Subjective: Patient reports feeling fine.  He still feels fatigued but denies any abdominal pain, chest pain, shortness of breath, or dizziness.  States his last bowel movement was green without any melena or hematochezia.  However, upon review of flowsheet, it was documented that patient had a small, black mushy stool earlier this morning.  ROS : Review of Systems  Respiratory: Negative for cough and shortness of breath.   Cardiovascular: Negative for chest pain.  Gastrointestinal: Positive for diarrhea and melena. Negative for abdominal pain, blood in stool, constipation, heartburn, nausea and vomiting.  Neurological: Negative for dizziness and loss of consciousness.   Objective: Vital signs in last 24 hours: Vitals:   04/29/20 0700 04/29/20 0718  BP: 112/62   Pulse: 92   Resp: 19   Temp:  99.9 F (37.7 C)  SpO2: (!) 89%     Physical Exam:  General:  Alert, oriented, cooperative, lying in bed in no distress, obese  Head:  Normocephalic, without obvious abnormality, atraumatic  Eyes:  Mild conjunctival pallor, EOMs intact  Lungs:   Clear to auscultation bilaterally, respirations unlabored  Heart:  Regular rate and rhythm, S1/S2 normal  Abdomen:   Soft, non-tender, bowel sounds active all four quadrants,  no guarding or peritoneal signs  Extremities: Extremities normal, atraumatic, no  edema  Pulses: 2+ and symmetric    Lab Results: Recent Labs    04/28/20 0229 04/29/20 0110  NA 144 143  K 4.9 4.6  CL 117* 108  CO2 17* 24  GLUCOSE 121* 118*  BUN 90* 64*  CREATININE 3.27* 2.88*  CALCIUM 8.5* 8.0*  MG  --  1.6*   Recent Labs    04/28/20 0229 04/29/20 0110  AST 121* 85*  ALT 192* 142*  ALKPHOS 173* 163*  BILITOT 1.1 0.8  PROT 6.2* 5.6*  ALBUMIN 2.0* 1.8*   Recent Labs    04/28/20 0229 04/28/20 1115 04/29/20 0110 04/29/20 0411  WBC 15.0*  --   13.9*  --   HGB 7.7*   < > 9.8*   9.7* 9.4*  HCT 24.7*   < > 30.2*   29.5* 28.6*  MCV 96.5  --  92.9  --   PLT 434*  --  400  --    < > = values in this interval not displayed.   No results for input(s): LABPROT, INR in the last 72 hours.  Iron deficiency anemia Hemoglobin was 7.9 on arrival yesterday 6/9.  He received 1 unit pRBCs (6/8 at 21:38) with insufficient rise in hemoglobin to 7.7.  He was then given a second unit of pRBCs with post-transfusion Hgb of 5.6.  He underwent emergent EGD and was given 2 more units of pRBCs.  EGD showed candidiasis esophagitis with no bleeding (cytology pending), LA Grade A reflux esophagitis with no bleeding, erosive gastropathy with stigmata of recent bleeding, and acute gastritis. Post-transfusion Hgb 10.3.  Hgb 9.4 today.  Sepsis, presumed urosepsis -CT 04/27/2020 showed left perinephric and periureteral fat stranding without hydronephrosis -WBCs 13.9 today, decreased from 15.0 yesterday -Urine culture >100,000 E. coli -BUN 64/Cr 2.88, improved from yesterday BUN 90/ Cr 3.27  Transaminitis: Improving. Likely shock liver in the setting of sepsis -T. Bili 0.8/ AST 85/ ALT 142/ ALP 163 compared to yesterday T. Bili 1.1/ AST 121/ ALT 192/ ALP 173 -Acute hepatitis panel negative -CT showed cholelithiasis without cholecystitis; no CBD dilation on  [place]  Plan: Colonoscopy tomorrow.  I thoroughly discussed the procedures, nature, benefits, and risks (including but not limited to bleeding, infection, perforation, anesthesia/cardiac and pulmonary complications) with the patient.  Patient verbalized understanding and gave verbal consent to proceed with the colonoscopy with Dr. Michail Sermon tomorrow.  Continue to monitor H&H with transfusion as needed to maintain Hgb >7 to 8.  Continue IV Protonix drip for now.  Continue IV diflucan for candida esophagitis.   [facility] will follow.   Salley Slaughter PA-C 04/29/2020, 11:02 AM  Contact #  [phone]

## 2020-04-29 NOTE — Progress Notes (Addendum)
PROGRESS NOTE    Donald Kim  GLO:[id number] DOB: [birth year] DOA: 04/27/2020 PCP: Tonye Becket., MD    Chief Complaint  Patient presents with  . Weakness  . Blood In Stools    Brief Narrative:  HPI per Dr. Mylo Red Kim is a 69 y.o. male with medical history significant of HTN, HLD.  Pt sent in to ED from PCP for lab work which demonstrates a new anemia.  Pt has had progressively worsening generalized weakness and fatigue over past 2 weeks.  2 weeks ago he developed fevers to 104 for about 4 days.  COVID was neg.  Had associated steatorrhea and diarrhea with this.  Some nausea, no vomiting.  No abd pain, no flank pain, no dysuria.  Diarrhea resolved, weakness persisted.  Pt went to PCP today.  PCP drew labs, sent to ED for evaluation of anemia.   ED Course: HGB 7.9, Creat 3.55 up from 1.7 in 2017, BUN 100, afebrile in ED.  HR 100-110 sinus.  SBP 93-114.  WBC 16.2k, AST 129 ALT 210.  UA with mod blood, mod LE, 6-10 wbs, 0 RBC.  Bicarb 15, AG 11.  Iron 16.  CT abd/pelvis results as noted below in note.   Assessment & Plan:   Principal Problem:   GI bleed Active Problems:   Pyelonephritis of left kidney   Acute-on-chronic kidney injury ([place])   Transaminitis   Diarrhea   Positive fecal occult blood test   Iron deficiency anemia   Metabolic acidosis with normal anion gap and bicarbonate losses   Elevated LFTs   Urinary tract infection with hematuria   Candida esophagitis (HCC)   Symptomatic anemia   E. coli UTI  1 GI bleed/iron deficiency anemia/positive heme stools CT abdomen and pelvis which was done was concerning for lymph node noted.  CT abdomen and pelvis also done concerning for possible pyelonephritis, 2 mm nonobstructing left renal calculus, cholelithiasis without cholecystitis.  Patient noted on presentation to present with generalized weakness.  Hemoglobin on admission noted to be 7.9.  Patient received a unit of packed red blood cells  and hemoglobin dropped further to 7.7.  Patient received another unit of packed red blood cells hemoglobin dropped to 5.6.  2 more units of packed red blood cells were transfused (04/28/2020) currently at 9.4 this morning.  Patient did state has been having some intermittent black watery stools that he describes as licorice.  Patient denies any NSAID use.  No prior history of peptic ulcer disease.  Due to significant drop in hemoglobin after 2 units of packed red blood cells down to 5.6 GI was consulted for emergent evaluation with endoscopy.  Patient underwent upper endoscopy (04/28/2020), which showed Candida esophagitis with no bleeding, cells with cytology obtained, LA grade a reflux esophagitis with no bleeding, erosive gastropathy with stigmata of recent bleeding, acute gastritis, normal examined duodenum.  Patient currently on a Protonix drip, IV fluids.  Patient being followed by GI and patient to be prepped today for colonoscopy to be done tomorrow for further evaluation.  Patient started on IV fluconazole for Candida esophagitis.  Continue clear liquids.  GI and PCCM following and I appreciate their input and recommendations.  2.  Candida esophagitis Noted on upper endoscopy.  Continue IV fluconazole day #2.  3.  Recent febrile illness/probable acute pyelonephritis/E. coli UTI Concern for acute pyelonephritis noted on CT scan.  Urinalysis done on admission with concerns for UTI.  Urine cultures with >100,000 colonies of E. coli.  Blood  cultures pending with no growth to date.  Continue IV Rocephin.  Supportive care.  4.  Acute kidney injury on chronic kidney disease stage III/non-anion gap metabolic acidosis Baseline creatinine approximately 1.7.  Likely secondary to a prerenal azotemia in the setting of ARB.  ARB on hold.  CT abdomen and pelvis which was done was negative for hydronephrosis.  Patient being hydrated with IV fluids and also being transfused packed red blood cells.  Renal function  slowly trending down.  Urinalysis worrisome for UTI.  Urine cultures pending.  Discontinue bicarb drip and placed on normal saline at 125 cc an hour.  Follow.   5.  Transaminitis Questionable etiology.  LFTs slowly trending down.  Acute hepatitis panel negative.  CT abdomen and pelvis with cholelithiasis without cholecystitis, liver noted to be unremarkable.  Monitor with hydration.  GI consulted and are following.  6.  Hypertension Continue to hold antihypertensive medications.  Change IV fluids from sodium bicarb to normal saline at 125 cc an hour.  Patient noted to have a bout of hypotension and given a bolus of IV fluids this morning.  7.  Hypomagnesemia Magnesium sulfate 4 g IV x1.   DVT prophylaxis: SCDs Code Status: Full Family Communication: Updated patient.  No family at bedside. Disposition:   Status is: Inpatient    Dispo: The patient is from: Home              Anticipated d/c is to: Home              Anticipated d/c date is: To be determined.              Patient currently not medically stable.  Patient status post 4 units packed red blood cells, hemoglobin now at 9.4 this morning.  Patient underwent emergent upper endoscopy 04/28/2020 with no overt bleeding noted.  Patient for colonoscopy tomorrow per GI.  Patient with a bout of hypotension this morning with some improvement with IV fluids.  Likely remain in the ICU as patient with high risk for decompensation.  PCCM following.        Consultants:   Gastroenterology: Dr. Michail Sermon 04/28/2020  PCCM: Dr. Tamala Julian 04/28/2020  Procedures:   Upper endoscopy 04/28/2020  Transfusion of 4 units packed red blood cells 04/28/2020  CT abdomen and pelvis 04/27/2020  Antimicrobials:   IV Rocephin 04/27/2020>>>>  IV fluconazole 04/28/2020   Subjective: Patient laying in bed on his side.  Denies any chest pain.  No shortness of breath.  Still with a small amount of loose stool this morning per patient.  Denies any flank pain.  No  dysuria.  Patient noted to have a bout of hypotension this morning and given a bolus of normal saline per PCCM.  Improvement with blood pressure.  Follow.  Sleeping but arousable.  Patient denies any chest pain.  No shortness of breath.    Objective: Vitals:   04/29/20 1113 04/29/20 1200 04/29/20 1300 04/29/20 1451  BP:  115/62 (!) 160/132   Pulse:  82 (!) 50   Resp:  17 18   Temp: 98.8 F (37.1 C)   98.4 F (36.9 C)  TempSrc: Oral   Oral  SpO2:  96% 93%   Weight:      Height:        Intake/Output Summary (Last 24 hours) at 04/29/2020 1529 Last data filed at 04/29/2020 1400 Gross per 24 hour  Intake 5141.3 ml  Output 1350 ml  Net 3791.3 ml   [employer]  04/27/20 1631  Weight: 124.7 kg    Examination:  General exam: Appears calm and comfortable  Respiratory system: Clear to auscultation. Respiratory effort normal. Cardiovascular system: S1 & S2 heard, RRR. No JVD, murmurs, rubs, gallops or clicks. No pedal edema. Gastrointestinal system: Abdomen is nondistended, soft and nontender. No organomegaly or masses felt. Normal bowel sounds heard. Central nervous system: Alert and oriented. No focal neurological deficits. Extremities: Symmetric 5 x 5 power. Skin: No rashes, lesions or ulcers Psychiatry: Judgement and insight appear normal. Mood & affect appropriate.     Data Reviewed: I have personally reviewed following labs and imaging studies  CBC: Recent Labs  Lab 04/27/20 1658 04/27/20 1658 04/28/20 0229 04/28/20 0229 04/28/20 1115 04/28/20 1645 04/29/20 0110 04/29/20 0411 04/29/20 1125  WBC 16.2*  --  15.0*  --   --   --  13.9*  --   --   HGB 7.9*   < > 7.7*   < > 5.6* 10.3* 9.8*  9.7* 9.4* 9.8*  HCT 25.6*   < > 24.7*   < > 16.7* 31.3* 30.2*  29.5* 28.6* 30.6*  MCV 97.7  --  96.5  --   --   --  92.9  --   --   PLT 460*  --  434*  --   --   --  400  --   --    < > = values in this interval not displayed.    Basic Metabolic Panel: Recent Labs  Lab  04/27/20 1658 04/28/20 0229 04/29/20 0110  NA 141 144 143  K 4.8 4.9 4.6  CL 115* 117* 108  CO2 15* 17* 24  GLUCOSE 164* 121* 118*  BUN 100* 90* 64*  CREATININE 3.55* 3.27* 2.88*  CALCIUM 8.7* 8.5* 8.0*  MG  --   --  1.6*    GFR: Estimated Creatinine Clearance: 31.2 mL/min (A) (by C-G formula based on SCr of 2.88 mg/dL (H)).  Liver Function Tests: Recent Labs  Lab 04/27/20 1658 04/28/20 0229 04/29/20 0110  AST 129* 121* 85*  ALT 210* 192* 142*  ALKPHOS 184* 173* 163*  BILITOT 0.6 1.1 0.8  PROT 6.7 6.2* 5.6*  ALBUMIN 2.2* 2.0* 1.8*    CBG: No results for input(s): GLUCAP in the last 168 hours.   Recent Results (from the past 240 hour(s))  SARS Coronavirus 2 by RT PCR (hospital order, performed in [facility] hospital lab) Nasopharyngeal Nasopharyngeal Swab     Status: None   Collection Time: 04/27/20 10:01 PM   Specimen: Nasopharyngeal Swab  Result Value Ref Range Status   SARS Coronavirus 2 NEGATIVE NEGATIVE Final    Comment: (NOTE) SARS-CoV-2 target nucleic acids are NOT DETECTED. The SARS-CoV-2 RNA is generally detectable in upper and lower respiratory specimens during the acute phase of infection. The lowest concentration of SARS-CoV-2 viral copies this assay can detect is 250 copies / mL. A negative result does not preclude SARS-CoV-2 infection and should not be used as the sole basis for treatment or other patient management decisions.  A negative result may occur with improper specimen collection / handling, submission of specimen other than nasopharyngeal swab, presence of viral mutation(s) within the areas targeted by this assay, and inadequate number of viral copies (<250 copies / mL). A negative result must be combined with clinical observations, patient history, and epidemiological information. Fact Sheet for Patients:   StrictlyIdeas.no Fact Sheet for Healthcare Providers: BankingDealers.co.za This  test is not yet approved or cleared  by  the [employer] and has been authorized for detection and/or diagnosis of SARS-CoV-2 by FDA under an Emergency Use Authorization (EUA).  This EUA will remain in effect (meaning this test can be used) for the duration of the COVID-19 declaration under Section 564(b)(1) of the Act, 21 U.S.C. section 360bbb-3(b)(1), unless the authorization is terminated or revoked sooner. Performed at [facility], [place] [address], [place], [place] [address]   Urine culture     Status: Abnormal   Collection Time: 04/27/20 10:10 PM   Specimen: Urine, Random  Result Value Ref Range Status   Specimen Description URINE, RANDOM  Final   Special Requests   Final    NONE Performed at [facility], [place] [address] [place], [place] [address]    Culture >=100,000 COLONIES/mL ESCHERICHIA COLI (A)  Final   Report Status 04/29/2020 FINAL  Final   Organism ID, Bacteria ESCHERICHIA COLI (A)  Final      Susceptibility   Escherichia coli - MIC*    AMPICILLIN <=2 SENSITIVE Sensitive     CEFAZOLIN <=4 SENSITIVE Sensitive     CEFTRIAXONE <=1 SENSITIVE Sensitive     CIPROFLOXACIN <=0.25 SENSITIVE Sensitive     GENTAMICIN <=1 SENSITIVE Sensitive     IMIPENEM <=0.25 SENSITIVE Sensitive     NITROFURANTOIN <=16 SENSITIVE Sensitive     TRIMETH/SULFA <=20 SENSITIVE Sensitive     AMPICILLIN/SULBACTAM <=2 SENSITIVE Sensitive     PIP/TAZO <=4 SENSITIVE Sensitive     * >=100,000 COLONIES/mL ESCHERICHIA COLI  Culture, blood (routine x 2)     Status: None (Preliminary result)   Collection Time: 04/28/20  2:30 AM   Specimen: BLOOD LEFT HAND  Result Value Ref Range Status   Specimen Description BLOOD LEFT HAND  Final   Special Requests   Final    BOTTLES DRAWN AEROBIC AND ANAEROBIC Blood Culture adequate volume   Culture   Final    NO GROWTH 1 DAY Performed at [facility], [address], [place], [place] [address]    Report Status PENDING  Incomplete    Culture, blood (routine x 2)     Status: None (Preliminary result)   Collection Time: 04/28/20  2:50 AM   Specimen: BLOOD RIGHT HAND  Result Value Ref Range Status   Specimen Description BLOOD RIGHT HAND  Final   Special Requests   Final    BOTTLES DRAWN AEROBIC AND ANAEROBIC Blood Culture results may not be optimal due to an excessive volume of blood received in culture bottles   Culture   Final    NO GROWTH 1 DAY Performed at [facility], [place] [address] [place], [place] [address]    Report Status PENDING  Incomplete  MRSA PCR Screening     Status: None   Collection Time: 04/28/20  4:00 PM   Specimen: Nasal Mucosa; Nasopharyngeal  Result Value Ref Range Status   MRSA by PCR NEGATIVE NEGATIVE Final    Comment:        The GeneXpert MRSA Assay (FDA approved for NASAL specimens only), is one component of a comprehensive MRSA colonization surveillance program. It is not intended to diagnose MRSA infection nor to guide or monitor treatment for MRSA infections. Performed at [facility], [place] [address], [place], [place] [address]          Radiology Studies: CT ABDOMEN PELVIS WO CONTRAST  Result Date: 04/27/2020 CLINICAL DATA:  Generalized weakness, blood in stool, elevated LFTs EXAM:  CT ABDOMEN AND PELVIS WITHOUT CONTRAST TECHNIQUE: Multidetector CT imaging of the abdomen and pelvis was performed following the standard protocol without IV contrast. COMPARISON:  02/03/2016, 04/27/2020 FINDINGS: Lower chest: No acute pleural or parenchymal lung disease. Hepatobiliary: Calcified gallstones are identified without cholecystitis. Liver is unremarkable. Pancreas: Unremarkable. No pancreatic ductal dilatation or surrounding inflammatory changes. Spleen: Normal in size without focal abnormality. Adrenals/Urinary Tract: There is left perinephric and Peri ureteral fat stranding. I do not see any evidence of hydronephrosis. There is a punctate less than 2 mm nonobstructing calculus  left kidney image 39. The appearance could reflect infection or recent passage of a calculus. Please correlate with urinalysis. The right kidney is unremarkable. The adrenals are normal. Bladder is unremarkable. Stomach/Bowel: No bowel obstruction or ileus. Diverticulosis of the distal colon without diverticulitis. No wall thickening or inflammatory change. Normal appendix right lower quadrant. Vascular/Lymphatic: Aortic atherosclerosis. There is a 2.2 by 2.5 cm low-attenuation structure along the left pelvic sidewall, just posterior to the left external iliac vein, reference image 84/3. This could reflect an enlarged lymph node. No other pathologic adenopathy. Reproductive: Prostate is unremarkable. Other: No free fluid or free gas. Small fat containing umbilical hernia unchanged. Musculoskeletal: No acute or destructive bony lesions. Reconstructed images demonstrate no additional findings. IMPRESSION: 1. Left perinephric and periureteral fat stranding, without hydronephrosis. The appearance could reflect infection or recent passage of a calculus. Please correlate with urinalysis. 2. Punctate less than 2 mm nonobstructing left renal calculus. 3. Cholelithiasis without cholecystitis. 4. Diverticulosis without diverticulitis. 5. A 2.2 by 2.5 cm low-attenuation structure along the left pelvic sidewall, just posterior to the left external iliac vein, likely an enlarged lymph node. Etiology indeterminate. Please correlate with any history of known malignancy. Short interval follow-up CT or PET-CT could be considered. 6. Aortic Atherosclerosis (ICD10-I70.0). Electronically Signed   By: Randa Ngo M.D.   On: 04/27/2020 21:27   US Abdomen Limited RUQ  Result Date: 04/27/2020 CLINICAL DATA:  Elevated LFTs EXAM: ULTRASOUND ABDOMEN LIMITED RIGHT UPPER QUADRANT COMPARISON:  None. FINDINGS: Gallbladder: No gallstones or wall thickening visualized. No sonographic Murphy sign noted by sonographer. Common bile duct:  Diameter: 5 mm Liver: Diffuse increased echogenicity with slightly heterogeneous liver. Appearance typically secondary to fatty infiltration. Fibrosis secondary consideration. No secondary findings of cirrhosis noted. No focal hepatic lesion or intrahepatic biliary duct dilatation. Portal vein is patent on color Doppler imaging with normal direction of blood flow towards the liver. Other: None. IMPRESSION: 1. No evidence for cholelithiasis or acute cholecystitis. 2. Hepatic steatosis. Electronically Signed   By: Constance Holster M.D.   On: 04/27/2020 20:59        Scheduled Meds: . sodium chloride   Intravenous Once  . acetaminophen  650 mg Oral Once  . atorvastatin  40 mg Oral QHS  . Chlorhexidine Gluconate Cloth  6 each Topical Daily  . diphenhydrAMINE  25 mg Oral Once  . ferrous sulfate  325 mg Oral TID WC  . furosemide  20 mg Intravenous Once  . [START ON 05/02/2020] pantoprazole  40 mg Intravenous Q12H  . polyethylene glycol-electrolytes  4,000 mL Oral Once   Continuous Infusions: . sodium chloride Stopped (04/27/20 2123)  . sodium chloride 125 mL/hr at 04/29/20 1137  . cefTRIAXone (ROCEPHIN)  IV Stopped (04/29/20 0054)  . fluconazole (DIFLUCAN) IV Stopped (04/28/20 2040)  . lactated ringers Stopped (04/29/20 0459)  . pantoprozole (PROTONIX) infusion 8 mg/hr (04/29/20 0932)     LOS: 1 day    Time  spent: 40 minutes    Irine Seal, MD [facility]   To contact the attending provider between 7A-7P or the covering provider during after hours 7P-7A, please log into the web site [email or web address] and access using universal [place] password for that web site. If you do not have the password, please call the hospital operator.  04/29/2020, 3:29 PM

## 2020-04-29 NOTE — Progress Notes (Signed)
[facility] Gastroenterology Progress Note  [name] 69 y.o. 04/22/1951  CC:  Iron deficiency anemia, melena  Subjective: Patient reports feeling fine.  He still feels fatigued but denies any abdominal pain, chest pain, shortness of breath, or dizziness.  States his last bowel movement was green without any melena or hematochezia.  However, upon review of flowsheet, it was documented that patient had a small, black mushy stool earlier this morning.  ROS : Review of Systems  Respiratory: Negative for cough and shortness of breath.   Cardiovascular: Negative for chest pain.  Gastrointestinal: Positive for diarrhea and melena. Negative for abdominal pain, blood in stool, constipation, heartburn, nausea and vomiting.  Neurological: Negative for dizziness and loss of consciousness.   Objective: Vital signs in last 24 hours: Vitals:   04/29/20 0700 04/29/20 0718  BP: 112/62   Pulse: 92   Resp: 19   Temp:  99.9 F (37.7 C)  SpO2: (!) 89%     Physical Exam:  General:  Alert, oriented, cooperative, lying in bed in no distress, obese  Head:  Normocephalic, without obvious abnormality, atraumatic  Eyes:  Mild conjunctival pallor, EOMs intact  Lungs:   Clear to auscultation bilaterally, respirations unlabored  Heart:  Regular rate and rhythm, S1/S2 normal  Abdomen:   Soft, non-tender, bowel sounds active all four quadrants,  no guarding or peritoneal signs  Extremities: Extremities normal, atraumatic, no  edema  Pulses: 2+ and symmetric    Lab Results: Recent Labs    04/28/20 0229 04/29/20 0110  NA 144 143  K 4.9 4.6  CL 117* 108  CO2 17* 24  GLUCOSE 121* 118*  BUN 90* 64*  CREATININE 3.27* 2.88*  CALCIUM 8.5* 8.0*  MG  --  1.6*   Recent Labs    04/28/20 0229 04/29/20 0110  AST 121* 85*  ALT 192* 142*  ALKPHOS 173* 163*  BILITOT 1.1 0.8  PROT 6.2* 5.6*  ALBUMIN 2.0* 1.8*   Recent Labs    04/28/20 0229 04/28/20 1115 04/29/20 0110 04/29/20 0411  WBC 15.0*  --   13.9*  --   HGB 7.7*   < > 9.8*   9.7* 9.4*  HCT 24.7*   < > 30.2*   29.5* 28.6*  MCV 96.5  --  92.9  --   PLT 434*  --  400  --    < > = values in this interval not displayed.   No results for input(s): LABPROT, INR in the last 72 hours.  Iron deficiency anemia Hemoglobin was 7.9 on arrival yesterday 6/9.  He received 1 unit pRBCs (6/8 at 21:38) with insufficient rise in hemoglobin to 7.7.  He was then given a second unit of pRBCs with post-transfusion Hgb of 5.6.  He underwent emergent EGD and was given 2 more units of pRBCs.  EGD showed candidiasis esophagitis with no bleeding (cytology pending), LA Grade A reflux esophagitis with no bleeding, erosive gastropathy with stigmata of recent bleeding, and acute gastritis. Post-transfusion Hgb 10.3.  Hgb 9.4 today.  Sepsis, presumed urosepsis -CT 04/27/2020 showed left perinephric and periureteral fat stranding without hydronephrosis -WBCs 13.9 today, decreased from 15.0 yesterday -Urine culture >100,000 E. coli -BUN 64/Cr 2.88, improved from yesterday BUN 90/ Cr 3.27  Transaminitis: Improving. Likely shock liver in the setting of sepsis -T. Bili 0.8/ AST 85/ ALT 142/ ALP 163 compared to yesterday T. Bili 1.1/ AST 121/ ALT 192/ ALP 173 -Acute hepatitis panel negative -CT showed cholelithiasis without cholecystitis; no CBD dilation on  Donald Kim  Plan: Colonoscopy tomorrow.  I thoroughly discussed the procedures, nature, benefits, and risks (including but not limited to bleeding, infection, perforation, anesthesia/cardiac and pulmonary complications) with the patient.  Patient verbalized understanding and gave verbal consent to proceed with the colonoscopy with Dr. Michail Sermon tomorrow.  Continue to monitor H&H with transfusion as needed to maintain Hgb >7 to 8.  Continue IV Protonix drip for now.  Continue IV diflucan for candida esophagitis.   [facility] will follow.   Salley Slaughter PA-C 04/29/2020, 11:02 AM  Contact #  [phone]

## 2020-04-30 ENCOUNTER — Inpatient Hospital Stay (HOSPITAL_COMMUNITY): Payer: Medicare HMO | Admitting: Anesthesiology

## 2020-04-30 ENCOUNTER — Encounter (HOSPITAL_COMMUNITY): Payer: Self-pay | Admitting: Internal Medicine

## 2020-04-30 ENCOUNTER — Encounter (HOSPITAL_COMMUNITY)
Admission: EM | Disposition: A | Discharge status: Home / Self Care | Payer: Self-pay | Source: Home / Self Care | Attending: Internal Medicine

## 2020-04-30 HISTORY — PX: COLONOSCOPY WITH PROPOFOL: SHX5780

## 2020-04-30 HISTORY — PX: POLYPECTOMY: SHX5525

## 2020-04-30 LAB — CBC
HCT: 32.1 % — ABNORMAL LOW (ref 39.0–52.0)
Hemoglobin: 10.2 g/dL — ABNORMAL LOW (ref 13.0–17.0)
MCH: 30.4 pg (ref 26.0–34.0)
MCHC: 31.8 g/dL (ref 30.0–36.0)
MCV: 95.5 fL (ref 80.0–100.0)
Platelets: 392 10*3/uL (ref 150–400)
RBC: 3.36 MIL/uL — ABNORMAL LOW (ref 4.22–5.81)
RDW: 14.5 % (ref 11.5–15.5)
WBC: 11.3 10*3/uL — ABNORMAL HIGH (ref 4.0–10.5)
nRBC: 0 % (ref 0.0–0.2)

## 2020-04-30 LAB — COMPREHENSIVE METABOLIC PANEL
ALT: 146 U/L — ABNORMAL HIGH (ref 0–44)
AST: 118 U/L — ABNORMAL HIGH (ref 15–41)
Albumin: 1.8 g/dL — ABNORMAL LOW (ref 3.5–5.0)
Alkaline Phosphatase: 166 U/L — ABNORMAL HIGH (ref 38–126)
Anion gap: 10 (ref 5–15)
BUN: 41 mg/dL — ABNORMAL HIGH (ref 8–23)
CO2: 24 mmol/L (ref 22–32)
Calcium: 7.9 mg/dL — ABNORMAL LOW (ref 8.9–10.3)
Chloride: 108 mmol/L (ref 98–111)
Creatinine, Ser: 2.65 mg/dL — ABNORMAL HIGH (ref 0.61–1.24)
GFR calc Af Amer: 27 mL/min — ABNORMAL LOW (ref >60)
GFR calc non Af Amer: 23 mL/min — ABNORMAL LOW (ref >60)
Glucose, Bld: 108 mg/dL — ABNORMAL HIGH (ref 70–99)
Potassium: 4.2 mmol/L (ref 3.5–5.1)
Sodium: 142 mmol/L (ref 135–145)
Total Bilirubin: 0.6 mg/dL (ref 0.3–1.2)
Total Protein: 5.7 g/dL — ABNORMAL LOW (ref 6.5–8.1)

## 2020-04-30 LAB — HEMOGLOBIN AND HEMATOCRIT, BLOOD
HCT: 32.2 % — ABNORMAL LOW (ref 39.0–52.0)
HCT: 30 % — ABNORMAL LOW (ref 39.0–52.0)
Hemoglobin: 10 g/dL — ABNORMAL LOW (ref 13.0–17.0)
Hemoglobin: 9.4 g/dL — ABNORMAL LOW (ref 13.0–17.0)

## 2020-04-30 LAB — MAGNESIUM: Magnesium: 2.2 mg/dL (ref 1.7–2.4)

## 2020-04-30 SURGERY — COLONOSCOPY WITH PROPOFOL
Anesthesia: Monitor Anesthesia Care

## 2020-04-30 MED ORDER — SODIUM CHLORIDE 0.9 % IV SOLN: INTRAVENOUS | Status: DC

## 2020-04-30 MED ORDER — LACTATED RINGERS IV SOLN
INTRAVENOUS | Status: AC | PRN
  Administered 2020-04-30: 1000 mL via INTRAVENOUS

## 2020-04-30 MED ORDER — PANTOPRAZOLE SODIUM 40 MG IV SOLR
40.0000 mg | INTRAVENOUS | Status: DC
  Administered 2020-04-30 – 2020-05-01 (×2): 40 mg via INTRAVENOUS
  Filled 2020-04-30 (×2): qty 40

## 2020-04-30 MED ORDER — PROPOFOL 10 MG/ML IV BOLUS
INTRAVENOUS | Status: DC | PRN
  Administered 2020-04-30: 10 mg via INTRAVENOUS
  Administered 2020-04-30 (×2): 20 mg via INTRAVENOUS

## 2020-04-30 MED ORDER — DEXMEDETOMIDINE HCL 200 MCG/2ML IV SOLN
INTRAVENOUS | Status: DC | PRN
  Administered 2020-04-30: 8 ug via INTRAVENOUS

## 2020-04-30 MED ORDER — PROPOFOL 500 MG/50ML IV EMUL
INTRAVENOUS | Status: DC | PRN
  Administered 2020-04-30: 100 ug/kg/min via INTRAVENOUS

## 2020-04-30 SURGICAL SUPPLY — 22 items

## 2020-04-30 NOTE — Anesthesia Postprocedure Evaluation (Signed)
Anesthesia Post Note  Patient: Donald Kim  Procedure(s) Performed: COLONOSCOPY WITH PROPOFOL (N/A ) POLYPECTOMY     Patient location during evaluation: Endoscopy Anesthesia Type: MAC Level of consciousness: awake and alert Pain management: pain level controlled Vital Signs Assessment: post-procedure vital signs reviewed and stable Respiratory status: spontaneous breathing, nonlabored ventilation, respiratory function stable and patient connected to nasal cannula oxygen Cardiovascular status: blood pressure returned to baseline and stable Postop Assessment: no apparent nausea or vomiting Anesthetic complications: no   No complications documented.  Last Vitals:  Vitals:   04/30/20 1157 04/30/20 1315  BP: (!) 144/87   Pulse:    Resp: 20   Temp: 36.5 C 36.8 C  SpO2: 100% 96%    Last Pain:  Vitals:   04/30/20 1315  TempSrc: Oral  PainSc: 0-No pain                 Barnet [place]

## 2020-04-30 NOTE — Anesthesia Preprocedure Evaluation (Signed)
Anesthesia Evaluation  Patient identified by MRN, date of birth, ID band Patient awake    Reviewed: Allergy & Precautions, NPO status , Patient's Chart, lab work & pertinent test results  Airway Mallampati: II  TM Distance: >3 FB Neck ROM: Full    Dental no notable dental hx. (+) Teeth Intact, Dental Advisory Given   Pulmonary neg pulmonary ROS,    Pulmonary exam normal breath sounds clear to auscultation       Cardiovascular Exercise Tolerance: Good hypertension, Pt. on medications Normal cardiovascular exam Rhythm:Regular Rate:Normal     Neuro/Psych negative neurological ROS  negative psych ROS   GI/Hepatic negative GI ROS, Neg liver ROS,   Endo/Other  negative endocrine ROS  Renal/GU CRFRenal diseaseLab Results      Component                Value               Date                      CREATININE               2.65 (H)            04/30/2020                BUN                      41 (H)              04/30/2020                NA                       142                 04/30/2020                K                        4.2                 04/30/2020                CL                       108                 04/30/2020                CO2                      24                  04/30/2020                Musculoskeletal negative musculoskeletal ROS (+)   Abdominal   Peds  Hematology Lab Results      Component                Value               Date                      WBC  11.3 (H)            04/30/2020                HGB                      10.2 (L)            04/30/2020                HCT                      32.1 (L)            04/30/2020                MCV                      95.5                04/30/2020                PLT                      392                 04/30/2020              Anesthesia Other Findings   Reproductive/Obstetrics                             Anesthesia Physical Anesthesia Plan  ASA: III  Anesthesia Plan: MAC   Post-op Pain Management:    Induction: Intravenous  PONV Risk Score and Plan: 3 and Treatment may vary due to age or medical condition, Ondansetron and Dexamethasone  Airway Management Planned: Natural Airway and Nasal Cannula  Additional Equipment: None  Intra-op Plan:   Post-operative Plan:   Informed Consent: I have reviewed the patients History and Physical, chart, labs and discussed the procedure including the risks, benefits and alternatives for the proposed anesthesia with the patient or authorized representative who has indicated his/her understanding and acceptance.     Dental advisory given  Plan Discussed with: CRNA  Anesthesia Plan Comments:         Anesthesia Quick Evaluation

## 2020-04-30 NOTE — Transfer of Care (Signed)
Immediate Anesthesia Transfer of Care Note  Patient: Donald Kim  Procedure(s) Performed: COLONOSCOPY WITH PROPOFOL (N/A ) POLYPECTOMY  Patient Location: Endoscopy Unit  Anesthesia Type:MAC  Level of Consciousness: awake, alert  and oriented  Airway & Oxygen Therapy: Patient Spontanous Breathing and Patient connected to nasal cannula oxygen  Post-op Assessment: Report given to RN, Post -op Vital signs reviewed and stable and Patient moving all extremities X 4  Post vital signs: Reviewed and stable  Last Vitals:  Vitals Value Taken Time  BP    Temp    Pulse    Resp 9 04/30/20 1313  SpO2    Vitals shown include unvalidated device data.  Last Pain:  Vitals:   04/30/20 1157  TempSrc: Oral  PainSc: 0-No pain      Patients Stated Pain Goal: 0 (84/13/24 4010)  Complications: No complications documented.

## 2020-04-30 NOTE — Progress Notes (Signed)
PROGRESS NOTE    Donald Kim  VVO:[id number] DOB: 1950-11-27 DOA: 04/27/2020 PCP: Tonye Becket., MD    Chief Complaint  Patient presents with  . Weakness  . Blood In Stools    Brief Narrative:  HPI per Dr. Mylo Red Kim is a 69 y.o. male with medical history significant of HTN, HLD.  Pt sent in to ED from PCP for lab work which demonstrates a new anemia.  Pt has had progressively worsening generalized weakness and fatigue over past 2 weeks.  2 weeks ago he developed fevers to 104 for about 4 days.  COVID was neg.  Had associated steatorrhea and diarrhea with this.  Some nausea, no vomiting.  No abd pain, no flank pain, no dysuria.  Diarrhea resolved, weakness persisted.  Pt went to PCP today.  PCP drew labs, sent to ED for evaluation of anemia.   ED Course: HGB 7.9, Creat 3.55 up from 1.7 in 2017, BUN 100, afebrile in ED.  HR 100-110 sinus.  SBP 93-114.  WBC 16.2k, AST 129 ALT 210.  UA with mod blood, mod LE, 6-10 wbs, 0 RBC.  Bicarb 15, AG 11.  Iron 16.  CT abd/pelvis results as noted below in note.   Assessment & Plan:   Principal Problem:   GI bleed Active Problems:   Pyelonephritis of left kidney   Acute-on-chronic kidney injury ([place])   Transaminitis   Diarrhea   Positive fecal occult blood test   Iron deficiency anemia   Metabolic acidosis with normal anion gap and bicarbonate losses   Elevated LFTs   Urinary tract infection with hematuria   Candida esophagitis ([facility])   Symptomatic anemia   E. coli UTI  1 GI bleed/iron deficiency anemia/positive heme stools CT abdomen and pelvis which was done was concerning for lymph node noted.  CT abdomen and pelvis also done concerning for possible pyelonephritis, 2 mm nonobstructing left renal calculus, cholelithiasis without cholecystitis.  Patient noted on presentation to present with generalized weakness.  Hemoglobin on admission noted to be 7.9.  Patient received a unit of packed red blood cells  and hemoglobin dropped further to 7.7.  Patient received another unit of packed red blood cells as hemoglobin dropped to 5.6.  2 more units of packed red blood cells were transfused (04/28/2020).  Hemoglobin currently at 10.2 this morning.  Patient did state has been having some intermittent black watery stools that he describes as licorice.  Patient denies any NSAID use.  No prior history of peptic ulcer disease.  Due to significant drop in hemoglobin to 5.6, after 2 units of packed red blood cells GI was consulted for emergent evaluation with endoscopy.  Patient underwent upper endoscopy (04/28/2020), which showed Candida esophagitis with no bleeding, cells with cytology obtained, LA grade a reflux esophagitis with no bleeding, erosive gastropathy with stigmata of recent bleeding, acute gastritis, normal examined duodenum.  Continue Protonix drip, IV fluids.  Patient being followed by GI and patient underwent bowel prep and patient for colonoscopy today for further evaluation.  Continue IV fluconazole for Candida esophagitis.  GI following and appreciate input and recommendations.   2.  Candida esophagitis Noted on upper endoscopy.  Continue IV fluconazole day #3.  3.  Recent febrile illness/probable acute pyelonephritis/E. coli UTI Concern for acute pyelonephritis noted on CT scan.  Urinalysis done on admission with concerns for UTI.  Urine cultures with >100,000 colonies of E. coli.  Blood cultures pending with no growth to date.  Continue IV Rocephin.  Supportive care.  4.  Acute kidney injury on chronic kidney disease stage III/non-anion gap metabolic acidosis Baseline creatinine approximately 1.7.  Likely secondary to a prerenal azotemia in the setting of ARB.  ARB on hold.  CT abdomen and pelvis which was done was negative for hydronephrosis.  Patient being hydrated with IV fluids and also being transfused packed red blood cells.  Renal function slowly trending down.  Urinalysis worrisome for UTI.   Urine cultures pending.  Discontinued bicarb drip and currently on normal saline at 125 cc/h.  Follow.   5.  Transaminitis Questionable etiology.  LFTs was initially trending down but starting to trend back up.  Acute hepatitis panel negative.  CT abdomen and pelvis with cholelithiasis without cholecystitis, liver noted to be unremarkable.  Monitor with hydration.  GI consulted and are following.  6.  Hypertension Continue to hold antihypertensive medications.  Continue IV fluids of normal saline.  Patient for colonoscopy today.  Follow.    7.  Hypomagnesemia Repleted.  Magnesium at 2.2.     DVT prophylaxis: SCDs Code Status: Full Family Communication: Updated patient.  No family at bedside. Disposition:   Status is: Inpatient    Dispo: The patient is from: Home              Anticipated d/c is to: Home              Anticipated d/c date is: To be determined.              Patient currently not medically stable.  Patient status post 4 units packed red blood cells,  Patient underwent emergent upper endoscopy 04/28/2020 with no overt bleeding noted.  Patient for colonoscopy today per GI.         Consultants:   Gastroenterology: Dr. Michail Sermon 04/28/2020  PCCM: Dr. Tamala Julian 04/28/2020  Procedures:   Upper endoscopy 04/28/2020  Transfusion of 4 units packed red blood cells 04/28/2020  CT abdomen and pelvis 04/27/2020  Antimicrobials:   IV Rocephin 04/27/2020>>>>  IV fluconazole 04/28/2020   Subjective: Patient in bed.  Denies any chest pain or shortness of breath.  Feels weakness has improved since admission.  Underwent bowel prep yesterday.  Denies any flank pain.  No dysuria.  Awaiting colonoscopy.   Objective: Vitals:   04/29/20 2000 04/30/20 0000 04/30/20 0400 04/30/20 0806  BP: 99/69 132/75 114/62 124/79  Pulse: 99 92 74   Resp:  20 18 (!) 21  Temp: 99.6 F (37.6 C) 99 F (37.2 C) 97.9 F (36.6 C) 98.1 F (36.7 C)  TempSrc: Oral Oral Axillary Oral  SpO2: 95% 96% 92% 95%    Weight:      Height:        Intake/Output Summary (Last 24 hours) at 04/30/2020 1017 Last data filed at 04/30/2020 0358 Gross per 24 hour  Intake 4390.02 ml  Output 450 ml  Net 3940.02 ml   Filed Weights   04/27/20 1631  Weight: 124.7 kg    Examination:  General exam: NAD.  Respiratory system: Decreased breath sounds in the bases.  Otherwise lungs clear to auscultation bilaterally.  No wheezes, no crackles, no rhonchi.  Normal respiratory effort.   Cardiovascular system: Regular rate rhythm no murmurs rubs or gallops.  No JVD.  No lower extremity edema.   Gastrointestinal system: Abdomen is soft, nontender, nondistended, positive bowel sounds.  No rebound.  No guarding.   Central nervous system: Alert and oriented. No focal neurological deficits. Extremities: Symmetric 5 x 5 power.  Skin: No rashes, lesions or ulcers Psychiatry: Judgement and insight appear normal. Mood & affect appropriate.     Data Reviewed: I have personally reviewed following labs and imaging studies  CBC: Recent Labs  Lab 04/27/20 1658 04/27/20 1658 04/28/20 0229 04/28/20 1115 04/29/20 0110 04/29/20 0411 04/29/20 1125 04/29/20 2147 04/30/20 0411  WBC 16.2*  --  15.0*  --  13.9*  --   --   --  11.3*  HGB 7.9*   < > 7.7*   < > 9.8*  9.7* 9.4* 9.8* 10.2* 10.2*  HCT 25.6*   < > 24.7*   < > 30.2*  29.5* 28.6* 30.6* 30.9* 32.1*  MCV 97.7  --  96.5  --  92.9  --   --   --  95.5  PLT 460*  --  434*  --  400  --   --   --  392   < > = values in this interval not displayed.    Basic Metabolic Panel: Recent Labs  Lab 04/27/20 1658 04/28/20 0229 04/29/20 0110 04/30/20 0411  NA 141 144 143 142  K 4.8 4.9 4.6 4.2  CL 115* 117* 108 108  CO2 15* 17* 24 24  GLUCOSE 164* 121* 118* 108*  BUN 100* 90* 64* 41*  CREATININE 3.55* 3.27* 2.88* 2.65*  CALCIUM 8.7* 8.5* 8.0* 7.9*  MG  --   --  1.6* 2.2    GFR: Estimated Creatinine Clearance: 33.9 mL/min (A) (by C-G formula based on SCr of 2.65 mg/dL  (H)).  Liver Function Tests: Recent Labs  Lab 04/27/20 1658 04/28/20 0229 04/29/20 0110 04/30/20 0411  AST 129* 121* 85* 118*  ALT 210* 192* 142* 146*  ALKPHOS 184* 173* 163* 166*  BILITOT 0.6 1.1 0.8 0.6  PROT 6.7 6.2* 5.6* 5.7*  ALBUMIN 2.2* 2.0* 1.8* 1.8*    CBG: No results for input(s): GLUCAP in the last 168 hours.   Recent Results (from the past 240 hour(s))  SARS Coronavirus 2 by RT PCR (hospital order, performed in [facility] hospital lab) Nasopharyngeal Nasopharyngeal Swab     Status: None   Collection Time: 04/27/20 10:01 PM   Specimen: Nasopharyngeal Swab  Result Value Ref Range Status   SARS Coronavirus 2 NEGATIVE NEGATIVE Final    Comment: (NOTE) SARS-CoV-2 target nucleic acids are NOT DETECTED. The SARS-CoV-2 RNA is generally detectable in upper and lower respiratory specimens during the acute phase of infection. The lowest concentration of SARS-CoV-2 viral copies this assay can detect is 250 copies / mL. A negative result does not preclude SARS-CoV-2 infection and should not be used as the sole basis for treatment or other patient management decisions.  A negative result may occur with improper specimen collection / handling, submission of specimen other than nasopharyngeal swab, presence of viral mutation(s) within the areas targeted by this assay, and inadequate number of viral copies (<250 copies / mL). A negative result must be combined with clinical observations, patient history, and epidemiological information. Fact Sheet for Patients:   StrictlyIdeas.no Fact Sheet for Healthcare Providers: BankingDealers.co.za This test is not yet approved or cleared  by the [place] FDA and has been authorized for detection and/or diagnosis of SARS-CoV-2 by FDA under an Emergency Use Authorization (EUA).  This EUA will remain in effect (meaning this test can be used) for the duration of the COVID-19 declaration  under Section 564(b)(1) of the Act, 21 U.S.C. section 360bbb-3(b)(1), unless the authorization is terminated or revoked sooner. Performed at [facility]  [facility], [place] [address], [place], [place] [address]   Urine culture     Status: Abnormal   Collection Time: 04/27/20 10:10 PM   Specimen: Urine, Random  Result Value Ref Range Status   Specimen Description URINE, RANDOM  Final   Special Requests   Final    NONE Performed at [facility], [place] [address], [place], [place] [address]    Culture >=100,000 COLONIES/mL ESCHERICHIA COLI (A)  Final   Report Status 04/29/2020 FINAL  Final   Organism ID, Bacteria ESCHERICHIA COLI (A)  Final      Susceptibility   Escherichia coli - MIC*    AMPICILLIN <=2 SENSITIVE Sensitive     CEFAZOLIN <=4 SENSITIVE Sensitive     CEFTRIAXONE <=1 SENSITIVE Sensitive     CIPROFLOXACIN <=0.25 SENSITIVE Sensitive     GENTAMICIN <=1 SENSITIVE Sensitive     IMIPENEM <=0.25 SENSITIVE Sensitive     NITROFURANTOIN <=16 SENSITIVE Sensitive     TRIMETH/SULFA <=20 SENSITIVE Sensitive     AMPICILLIN/SULBACTAM <=2 SENSITIVE Sensitive     PIP/TAZO <=4 SENSITIVE Sensitive     * >=100,000 COLONIES/mL ESCHERICHIA COLI  Culture, blood (routine x 2)     Status: None (Preliminary result)   Collection Time: 04/28/20  2:30 AM   Specimen: BLOOD LEFT HAND  Result Value Ref Range Status   Specimen Description BLOOD LEFT HAND  Final   Special Requests   Final    BOTTLES DRAWN AEROBIC AND ANAEROBIC Blood Culture adequate volume   Culture   Final    NO GROWTH 2 DAYS Performed at [facility], [address], [place], [place] [address]    Report Status PENDING  Incomplete  Culture, blood (routine x 2)     Status: None (Preliminary result)   Collection Time: 04/28/20  2:50 AM   Specimen: BLOOD RIGHT HAND  Result Value Ref Range Status   Specimen Description BLOOD RIGHT HAND  Final   Special Requests   Final    BOTTLES DRAWN AEROBIC AND ANAEROBIC Blood Culture  results may not be optimal due to an excessive volume of blood received in culture bottles   Culture   Final    NO GROWTH 2 DAYS Performed at [facility], [place] [address] [place], [place] [address]    Report Status PENDING  Incomplete  MRSA PCR Screening     Status: None   Collection Time: 04/28/20  4:00 PM   Specimen: Nasal Mucosa; Nasopharyngeal  Result Value Ref Range Status   MRSA by PCR NEGATIVE NEGATIVE Final    Comment:        The GeneXpert MRSA Assay (FDA approved for NASAL specimens only), is one component of a comprehensive MRSA colonization surveillance program. It is not intended to diagnose MRSA infection nor to guide or monitor treatment for MRSA infections. Performed at [facility], [place] [address] [place], [place] [address]          Radiology Studies: No results found.      Scheduled Meds: . sodium chloride   Intravenous Once  . acetaminophen  650 mg Oral Once  . atorvastatin  40 mg Oral QHS  . Chlorhexidine Gluconate Cloth  6 each Topical Daily  . diphenhydrAMINE  25 mg Oral Once  . ferrous sulfate  325 mg Oral TID WC  . furosemide  20 mg Intravenous Once  . [START ON 05/02/2020] pantoprazole  40 mg Intravenous Q12H   Continuous Infusions: .  sodium chloride Stopped (04/27/20 2123)  . sodium chloride 125 mL/hr at 04/29/20 1902  . cefTRIAXone (ROCEPHIN)  IV 1 g (04/30/20 0003)  . fluconazole (DIFLUCAN) IV 200 mg (04/29/20 2207)  . lactated ringers Stopped (04/29/20 0459)  . pantoprozole (PROTONIX) infusion 8 mg/hr (04/29/20 1500)     LOS: 2 days    Time spent: 40 minutes    Irine Seal, MD [facility]   To contact the attending provider between 7A-7P or the covering provider during after hours 7P-7A, please log into the web site [email or web address] and access using universal [place] password for that web site. If you do not have the password, please call the hospital operator.  04/30/2020, 10:17 AM

## 2020-04-30 NOTE — Brief Op Note (Addendum)
Sigmoid diverticulosis. Small benign-appearing colon polyp removed with a hot snare. Small internal hemorrhoids. No blood in colon. Capsule endoscopy today to look for a small bowel source of the melena. Diet recs per capsule endoscopy order sheet. EGD brushing consistent with Candida esophagitis and patient aware. On IV Diflucan since 04/28/20. Dr. Cristina Gong will f/u with capsule endoscopy results this weekend.

## 2020-04-30 NOTE — Op Note (Signed)
[facility] Patient Name: Donald Kim Procedure Date : 04/30/2020 MRN: [id number] Attending MD: [name] , MD Date of Birth: 05-02-1951 CSN: [id number] Age: 69 Admit Type: Inpatient Procedure:                Colonoscopy Indications:              This is the patient's first colonoscopy, Melena Providers:                [name], MD, Lazaro Arms, Technician,                            Burtis Junes, RN Referring MD:             hospital team Medicines:                Propofol per Anesthesia, Monitored Anesthesia Care Complications:            No immediate complications. Estimated Blood Loss:     Estimated blood loss: none. Procedure:                Pre-Anesthesia Assessment:                           - Prior to the procedure, a History and Physical                            was performed, and patient medications and                            allergies were reviewed. The patient's tolerance of                            previous anesthesia was also reviewed. The risks                            and benefits of the procedure and the sedation                            options and risks were discussed with the patient.                            All questions were answered, and informed consent                            was obtained. Prior Anticoagulants: The patient has                            taken no previous anticoagulant or antiplatelet                            agents. ASA Grade Assessment: III - A patient with                            severe systemic disease. After reviewing the risks  and benefits, the patient was deemed in                            satisfactory condition to undergo the procedure.                           After obtaining informed consent, the colonoscope                            was passed under direct vision. Throughout the                            procedure, the patient's blood pressure,  pulse, and                            oxygen saturations were monitored continuously. The                            [id number]-[id number] ([id number] Olympus pediatric colonscope                            was introduced through the anus and advanced to the                            the cecum, identified by appendiceal orifice and                            ileocecal valve. The colonoscopy was performed                            without difficulty. The patient tolerated the                            procedure well. The quality of the bowel                            preparation was fair and fair but repeated                            irrigation led to a good and adequate prep. The                            terminal ileum, ileocecal valve, appendiceal                            orifice, and rectum were photographed. Scope In: 12:58:17 PM Scope Out: 1:06:53 PM Scope Withdrawal Time: 0 hours 5 minutes 36 seconds  Total Procedure Duration: 0 hours 8 minutes 36 seconds  Findings:      The perianal and digital rectal examinations were normal.      Multiple small and large-mouthed diverticula were found in the sigmoid       colon.      A 7 mm polyp was found in the descending colon. The polyp was sessile.       The  polyp was removed with a hot snare. Resection and retrieval were       complete. Estimated blood loss: none.      Internal hemorrhoids were found during retroflexion. The hemorrhoids       were small and Grade I (internal hemorrhoids that do not prolapse).      The terminal ileum appeared normal. Impression:               - Preparation of the colon was fair.                           - Diverticulosis in the sigmoid colon.                           - One 7 mm polyp in the descending colon, removed                            with a hot snare. Resected and retrieved.                           - Internal hemorrhoids.                           - The examined portion of the ileum was  normal. Recommendation:           - NPO.                           - To visualize the small bowel, perform video                            capsule endoscopy.                           - Await pathology results.                           - Repeat colonoscopy for surveillance based on                            pathology results. Procedure Code(s):        --- Professional ---                           [phone], Colonoscopy, flexible; with removal of                            tumor(s), polyp(s), or other lesion(s) by snare                            technique Diagnosis Code(s):        --- Professional ---                           K92.1, Melena (includes Hematochezia)                           K63.5, Polyp of colon  K64.0, First degree hemorrhoids                           K57.30, Diverticulosis of large intestine without                            perforation or abscess without bleeding CPT copyright 2019 American Medical Association. All rights reserved. The codes documented in this report are preliminary and upon coder review may  be revised to meet current compliance requirements. [name], MD 04/30/2020 1:14:22 PM This report has been signed electronically. Number of Addenda: 0

## 2020-04-30 NOTE — Progress Notes (Signed)
Pt in bed resting, pt currently hesitant to continue drinking the ordered colon prep (has finished half of the container) and stated that he was too tired, validated pts feelings and educated him on the importance of completing the colon prep, pt verbalized understanding but would not drink any of the colon prep at this time.

## 2020-04-30 NOTE — Progress Notes (Signed)
Pt swallowed pill cam @ 1350 with no difficulties. Instructions went over with both the patient and RN who both verbalized understanding

## 2020-04-30 NOTE — Interval H&P Note (Signed)
History and Physical Interval Note:  04/30/2020 12:38 PM  Donald Kim  has presented today for surgery, with the diagnosis of iron-deficiency anemia, heme-positive stools.  The various methods of treatment have been discussed with the patient and family. After consideration of risks, benefits and other options for treatment, the patient has consented to  Procedure(s): COLONOSCOPY WITH PROPOFOL (N/A) as a surgical intervention.  The patient's history has been reviewed, patient examined, no change in status, stable for surgery.  I have reviewed the patient's chart and labs.  Questions were answered to the patient's satisfaction.     [name]

## 2020-04-30 NOTE — Anesthesia Procedure Notes (Signed)
Procedure Name: MAC Date/Time: 04/30/2020 12:45 PM Performed by: Mariea Clonts, CRNA Pre-anesthesia Checklist: Patient identified, Emergency Drugs available, Suction available, Patient being monitored and Timeout performed Patient Re-evaluated:Patient Re-evaluated prior to induction Oxygen Delivery Method: Simple face mask and Nasal cannula

## 2020-05-01 LAB — CBC
HCT: 29.1 % — ABNORMAL LOW (ref 39.0–52.0)
Hemoglobin: 9.2 g/dL — ABNORMAL LOW (ref 13.0–17.0)
MCH: 30.8 pg (ref 26.0–34.0)
MCHC: 31.6 g/dL (ref 30.0–36.0)
MCV: 97.3 fL (ref 80.0–100.0)
Platelets: 318 10*3/uL (ref 150–400)
RBC: 2.99 MIL/uL — ABNORMAL LOW (ref 4.22–5.81)
RDW: 14.2 % (ref 11.5–15.5)
WBC: 10.4 10*3/uL (ref 4.0–10.5)
nRBC: 0 % (ref 0.0–0.2)

## 2020-05-01 LAB — COMPREHENSIVE METABOLIC PANEL
ALT: 133 U/L — ABNORMAL HIGH (ref 0–44)
AST: 117 U/L — ABNORMAL HIGH (ref 15–41)
Albumin: 1.8 g/dL — ABNORMAL LOW (ref 3.5–5.0)
Alkaline Phosphatase: 160 U/L — ABNORMAL HIGH (ref 38–126)
Anion gap: 13 (ref 5–15)
BUN: 32 mg/dL — ABNORMAL HIGH (ref 8–23)
CO2: 22 mmol/L (ref 22–32)
Calcium: 7.8 mg/dL — ABNORMAL LOW (ref 8.9–10.3)
Chloride: 109 mmol/L (ref 98–111)
Creatinine, Ser: 2.38 mg/dL — ABNORMAL HIGH (ref 0.61–1.24)
GFR calc Af Amer: 31 mL/min — ABNORMAL LOW (ref >60)
GFR calc non Af Amer: 26 mL/min — ABNORMAL LOW (ref >60)
Glucose, Bld: 101 mg/dL — ABNORMAL HIGH (ref 70–99)
Potassium: 4.2 mmol/L (ref 3.5–5.1)
Sodium: 144 mmol/L (ref 135–145)
Total Bilirubin: 0.5 mg/dL (ref 0.3–1.2)
Total Protein: 5.5 g/dL — ABNORMAL LOW (ref 6.5–8.1)

## 2020-05-01 LAB — HEMOGLOBIN AND HEMATOCRIT, BLOOD
HCT: 29.9 % — ABNORMAL LOW (ref 39.0–52.0)
Hemoglobin: 9.3 g/dL — ABNORMAL LOW (ref 13.0–17.0)

## 2020-05-01 LAB — MAGNESIUM: Magnesium: 1.9 mg/dL (ref 1.7–2.4)

## 2020-05-01 MED ORDER — FLUCONAZOLE 100 MG PO TABS
200.0000 mg | ORAL_TABLET | Freq: Every day | ORAL | Status: DC
  Administered 2020-05-01: 200 mg via ORAL
  Filled 2020-05-01: qty 2

## 2020-05-01 NOTE — Progress Notes (Addendum)
  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  There was no blood anywhere in the GI tract noted during the 12 hours of the study.  The cause of the patient's anemia remains unclear.  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  I see where patient's hemoglobin is basically stable.  I think at this point discharge home is an option.  Would recommend outpatient Hemoccult and hemoglobin monitoring periodically through the office of his primary physician.  Cleotis Nipper, M.D. Pager [phone] If no answer or after 5 PM call [phone]

## 2020-05-01 NOTE — Progress Notes (Signed)
PROGRESS NOTE    Donald Kim  IOE:[id number] DOB: 1951-06-30 DOA: 04/27/2020 PCP: Tonye Becket., MD    Chief Complaint  Patient presents with   Weakness   Blood In Stools    Brief Narrative:  HPI per Dr. Mylo Red Shi is a 69 y.o. male with medical history significant of HTN, HLD.  Pt sent in to ED from PCP for lab work which demonstrates a new anemia.  Pt has had progressively worsening generalized weakness and fatigue over past 2 weeks.  2 weeks ago he developed fevers to 104 for about 4 days.  COVID was neg.  Had associated steatorrhea and diarrhea with this.  Some nausea, no vomiting.  No abd pain, no flank pain, no dysuria.  Diarrhea resolved, weakness persisted.  Pt went to PCP today.  PCP drew labs, sent to ED for evaluation of anemia.   ED Course: HGB 7.9, Creat 3.55 up from 1.7 in 2017, BUN 100, afebrile in ED.  HR 100-110 sinus.  SBP 93-114.  WBC 16.2k, AST 129 ALT 210.  UA with mod blood, mod LE, 6-10 wbs, 0 RBC.  Bicarb 15, AG 11.  Iron 16.  CT abd/pelvis results as noted below in note.   Assessment & Plan:   Principal Problem:   GI bleed Active Problems:   Pyelonephritis of left kidney   Acute-on-chronic kidney injury ([place])   Transaminitis   Diarrhea   Positive fecal occult blood test   Iron deficiency anemia   Metabolic acidosis with normal anion gap and bicarbonate losses   Elevated LFTs   Urinary tract infection with hematuria   Candida esophagitis ([facility])   Symptomatic anemia   E. coli UTI  1 GI bleed/iron deficiency anemia/positive heme stools CT abdomen and pelvis which was done was concerning for lymph node noted.  CT abdomen and pelvis also done concerning for possible pyelonephritis, 2 mm nonobstructing left renal calculus, cholelithiasis without cholecystitis.  Patient noted on presentation to present with generalized weakness.  Hemoglobin on admission noted to be 7.9.  Patient received a unit of packed red blood cells  and hemoglobin dropped further to 7.7.  Patient received another unit of packed red blood cells as hemoglobin dropped to 5.6.  2 more units of packed red blood cells were transfused (04/28/2020).  Hemoglobin currently at 9.2 this morning.  Patient did state has been having some intermittent black watery stools that he describes as licorice.  Patient denies any NSAID use.  No prior history of peptic ulcer disease.  Due to significant drop in hemoglobin to 5.6, after 2 units of packed red blood cells GI was consulted for emergent evaluation with endoscopy.  Patient underwent upper endoscopy (04/28/2020), which showed Candida esophagitis with no bleeding, cells with cytology obtained, LA grade a reflux esophagitis with no bleeding, erosive gastropathy with stigmata of recent bleeding, acute gastritis, normal examined duodenum.  Patient underwent colonoscopy (04/30/2020) showed a sigmoid diverticulosis, small benign appearing colonic polyp status post removal, small internal hemorrhoids with no blood noted in the colon.  Patient underwent capsule endoscopy which is currently pending.  Transition from IV fluconazole to oral fluconazole for treatment of Candida esophagitis noted on upper endoscopy.  Patient was on a Protonix drip and currently now on IV daily Protonix.  Could likely transition to oral Protonix tomorrow. GI following and appreciate input and recommendations.   2.  Candida esophagitis Noted on upper endoscopy.  We will transition from IV fluconazole to oral fluconazole 200 mg daily and  treat for total of 14 days.  Antifungal day # 4/14.  3.  Recent febrile illness/Acute pyelonephritis/E. coli UTI Concern for acute pyelonephritis noted on CT scan.  Urinalysis done on admission with concerns for UTI.  Urine cultures with >100,000 colonies of E. coli.  Blood cultures pending with no growth to date.  Continue IV Rocephin and likely transition to oral antibiotics tomorrow.  Supportive care.  4.  Acute kidney  injury on chronic kidney disease stage III/non-anion gap metabolic acidosis Baseline creatinine approximately 1.7.  Likely secondary to a prerenal azotemia in the setting of ARB.  ARB on hold.  CT abdomen and pelvis which was done was negative for hydronephrosis.  Patient being hydrated with IV fluids and also transfused packed red blood cells.  Renal function slowly trending down with creatinine currently at 2.38.  Urinalysis worrisome for UTI.  Urine cultures with > 100,000 colonies of E. coli, pansensitive.  Bicarb drip discontinued.  Saline lock IV fluids.  Follow.    5.  Transaminitis Questionable etiology.  LFTs was initially trending down but fluctuating.  Acute hepatitis panel negative. CT abdomen and pelvis with cholelithiasis without cholecystitis, liver noted to be unremarkable.  Monitor with hydration.  GI consulted and are following.  6.  Hypertension Continue to hold antihypertensive medications.  Saline lock IV fluids.  Follow.    7.  Hypomagnesemia Repleted.  Magnesium at 1.9.     DVT prophylaxis: SCDs Code Status: Full Family Communication: Updated patient.  No family at bedside. Disposition:   Status is: Inpatient    Dispo: The patient is from: Home              Anticipated d/c is to: Home              Anticipated d/c date is: To be determined.              Patient currently not medically stable.  Patient status post 4 units packed red blood cells,  Patient underwent emergent upper endoscopy 04/28/2020 with no overt bleeding noted.  Patient underwent colonoscopy as well as capsule endoscopy 04/30/2020.       Consultants:   Gastroenterology: Dr. Michail Sermon 04/28/2020  PCCM: Dr. Tamala Julian 04/28/2020  Procedures:   Upper endoscopy 04/28/2020  Transfusion of 4 units packed red blood cells 04/28/2020  CT abdomen and pelvis 04/27/2020  Colonoscopy 04/30/2020  Capsule endoscopy 04/30/2020  Antimicrobials:   IV Rocephin 04/27/2020>>>>  IV fluconazole 04/28/2020>>> oral  fluconazole 05/01/2020   Subjective: Patient sleeping but arousable.  Denies chest pain or shortness of breath.  Denies any abdominal pain.  Denies any dysuria.  Feeling better.  Asking whether he can shower.    Objective: Vitals:   04/30/20 2045 05/01/20 0000 05/01/20 0358 05/01/20 0449  BP: 130/74 124/79 107/64   Pulse: 96 88 90   Resp: 16 17 20    Temp: 99 F (37.2 C) 98.3 F (36.8 C) 98.3 F (36.8 C)   TempSrc: Oral Oral Oral   SpO2: 99% 96% 99%   Weight:    124.5 kg  Height:        Intake/Output Summary (Last 24 hours) at 05/01/2020 1141 Last data filed at 05/01/2020 0450 Gross per 24 hour  Intake 2836.55 ml  Output 250 ml  Net 2586.55 ml   Filed Weights   04/27/20 1631 04/30/20 1157 05/01/20 0449  Weight: 124.7 kg 124.7 kg 124.5 kg    Examination:  General exam: NAD.  Respiratory system: Some decreased breath sounds in  the bases otherwise clear.  No wheezes, no crackles, no rhonchi.  Normal respiratory effort.  Cardiovascular system: RRR no murmurs rubs or gallops.  No JVD.  No lower extremity edema.  Gastrointestinal system: Abdomen is nontender, nondistended, soft, positive bowel sounds.  No rebound.  No guarding.   Central nervous system: Alert and oriented. No focal neurological deficits. Extremities: Symmetric 5 x 5 power. Skin: No rashes, lesions or ulcers Psychiatry: Judgement and insight appear normal. Mood & affect appropriate.     Data Reviewed: I have personally reviewed following labs and imaging studies  CBC: Recent Labs  Lab 04/27/20 1658 04/27/20 1658 04/28/20 0229 04/28/20 1115 04/29/20 0110 04/29/20 0411 04/29/20 2147 04/30/20 0411 04/30/20 1139 04/30/20 2017 05/01/20 0245  WBC 16.2*  --  15.0*  --  13.9*  --   --  11.3*  --   --  10.4  HGB 7.9*   < > 7.7*   < > 9.8*   9.7*   < > 10.2* 10.2* 10.0* 9.4* 9.2*  HCT 25.6*   < > 24.7*   < > 30.2*   29.5*   < > 30.9* 32.1* 32.2* 30.0* 29.1*  MCV 97.7  --  96.5  --  92.9  --   --  95.5  --    --  97.3  PLT 460*  --  434*  --  400  --   --  392  --   --  318   < > = values in this interval not displayed.    Basic Metabolic Panel: Recent Labs  Lab 04/27/20 1658 04/28/20 0229 04/29/20 0110 04/30/20 0411 05/01/20 0245  NA 141 144 143 142 144  K 4.8 4.9 4.6 4.2 4.2  CL 115* 117* 108 108 109  CO2 15* 17* 24 24 22   GLUCOSE 164* 121* 118* 108* 101*  BUN 100* 90* 64* 41* 32*  CREATININE 3.55* 3.27* 2.88* 2.65* 2.38*  CALCIUM 8.7* 8.5* 8.0* 7.9* 7.8*  MG  --   --  1.6* 2.2 1.9    GFR: Estimated Creatinine Clearance: 37.7 mL/min (A) (by C-G formula based on SCr of 2.38 mg/dL (H)).  Liver Function Tests: Recent Labs  Lab 04/27/20 1658 04/28/20 0229 04/29/20 0110 04/30/20 0411 05/01/20 0245  AST 129* 121* 85* 118* 117*  ALT 210* 192* 142* 146* 133*  ALKPHOS 184* 173* 163* 166* 160*  BILITOT 0.6 1.1 0.8 0.6 0.5  PROT 6.7 6.2* 5.6* 5.7* 5.5*  ALBUMIN 2.2* 2.0* 1.8* 1.8* 1.8*    CBG: No results for input(s): GLUCAP in the last 168 hours.   Recent Results (from the past 240 hour(s))  SARS Coronavirus 2 by RT PCR (hospital order, performed in [facility] hospital lab) Nasopharyngeal Nasopharyngeal Swab     Status: None   Collection Time: 04/27/20 10:01 PM   Specimen: Nasopharyngeal Swab  Result Value Ref Range Status   SARS Coronavirus 2 NEGATIVE NEGATIVE Final    Comment: (NOTE) SARS-CoV-2 target nucleic acids are NOT DETECTED. The SARS-CoV-2 RNA is generally detectable in upper and lower respiratory specimens during the acute phase of infection. The lowest concentration of SARS-CoV-2 viral copies this assay can detect is 250 copies / mL. A negative result does not preclude SARS-CoV-2 infection and should not be used as the sole basis for treatment or other patient management decisions.  A negative result may occur with improper specimen collection / handling, submission of specimen other than nasopharyngeal swab, presence of viral mutation(s) within  the areas  targeted by this assay, and inadequate number of viral copies (<250 copies / mL). A negative result must be combined with clinical observations, patient history, and epidemiological information. Fact Sheet for Patients:   StrictlyIdeas.no Fact Sheet for Healthcare Providers: BankingDealers.co.za This test is not yet approved or cleared  by the [place] FDA and has been authorized for detection and/or diagnosis of SARS-CoV-2 by FDA under an Emergency Use Authorization (EUA).  This EUA will remain in effect (meaning this test can be used) for the duration of the COVID-19 declaration under Section 564(b)(1) of the Act, 21 U.S.C. section 360bbb-3(b)(1), unless the authorization is terminated or revoked sooner. Performed at [facility], [place] [address] [place], [place] [address]   Urine culture     Status: Abnormal   Collection Time: 04/27/20 10:10 PM   Specimen: Urine, Random  Result Value Ref Range Status   Specimen Description URINE, RANDOM  Final   Special Requests   Final    NONE Performed at [facility], [place] [address], [place], [place] [address]    Culture >=100,000 COLONIES/mL ESCHERICHIA COLI (A)  Final   Report Status 04/29/2020 FINAL  Final   Organism ID, Bacteria ESCHERICHIA COLI (A)  Final      Susceptibility   Escherichia coli - MIC*    AMPICILLIN <=2 SENSITIVE Sensitive     CEFAZOLIN <=4 SENSITIVE Sensitive     CEFTRIAXONE <=1 SENSITIVE Sensitive     CIPROFLOXACIN <=0.25 SENSITIVE Sensitive     GENTAMICIN <=1 SENSITIVE Sensitive     IMIPENEM <=0.25 SENSITIVE Sensitive     NITROFURANTOIN <=16 SENSITIVE Sensitive     TRIMETH/SULFA <=20 SENSITIVE Sensitive     AMPICILLIN/SULBACTAM <=2 SENSITIVE Sensitive     PIP/TAZO <=4 SENSITIVE Sensitive     * >=100,000 COLONIES/mL ESCHERICHIA COLI  Culture, blood (routine x 2)     Status: None (Preliminary result)   Collection Time: 04/28/20  2:30 AM    Specimen: BLOOD LEFT HAND  Result Value Ref Range Status   Specimen Description BLOOD LEFT HAND  Final   Special Requests   Final    BOTTLES DRAWN AEROBIC AND ANAEROBIC Blood Culture adequate volume   Culture   Final    NO GROWTH 3 DAYS Performed at [facility], [address] [place], [place] [address]    Report Status PENDING  Incomplete  Culture, blood (routine x 2)     Status: None (Preliminary result)   Collection Time: 04/28/20  2:50 AM   Specimen: BLOOD RIGHT HAND  Result Value Ref Range Status   Specimen Description BLOOD RIGHT HAND  Final   Special Requests   Final    BOTTLES DRAWN AEROBIC AND ANAEROBIC Blood Culture results may not be optimal due to an excessive volume of blood received in culture bottles   Culture   Final    NO GROWTH 3 DAYS Performed at [facility], [place] [address] [place], [place] [address]    Report Status PENDING  Incomplete  MRSA PCR Screening     Status: None   Collection Time: 04/28/20  4:00 PM   Specimen: Nasal Mucosa; Nasopharyngeal  Result Value Ref Range Status   MRSA by PCR NEGATIVE NEGATIVE Final    Comment:        The GeneXpert MRSA Assay (FDA approved for NASAL specimens only), is one component of a comprehensive MRSA colonization surveillance program. It is not intended to diagnose MRSA infection nor to guide  or monitor treatment for MRSA infections. Performed at [facility], [place] [address] [place], [place] [address]          Radiology Studies: No results found.      Scheduled Meds:  sodium chloride   Intravenous Once   acetaminophen  650 mg Oral Once   atorvastatin  40 mg Oral QHS   Chlorhexidine Gluconate Cloth  6 each Topical Daily   diphenhydrAMINE  25 mg Oral Once   ferrous sulfate  325 mg Oral TID WC   fluconazole  200 mg Oral Daily   furosemide  20 mg Intravenous Once   pantoprazole (PROTONIX) IV  40 mg Intravenous Q24H   Continuous Infusions:  sodium chloride Stopped  (04/27/20 2123)   sodium chloride 75 mL/hr at 04/30/20 1920   sodium chloride     cefTRIAXone (ROCEPHIN)  IV 1 g (04/30/20 2327)   lactated ringers Stopped (04/29/20 0459)     LOS: 3 days    Time spent: 40 minutes    Irine Seal, MD [facility]   To contact the attending provider between 7A-7P or the covering provider during after hours 7P-7A, please log into the web site [email or web address] and access using universal [place] password for that web site. If you do not have the password, please call the hospital operator.  05/01/2020, 11:41 AM

## 2020-05-01 NOTE — Progress Notes (Signed)
Belt removed from procedure on 6/11 and placed in bag for pick up.

## 2020-05-02 ENCOUNTER — Encounter (HOSPITAL_COMMUNITY): Payer: Self-pay | Admitting: Gastroenterology

## 2020-05-02 LAB — COMPREHENSIVE METABOLIC PANEL
ALT: 124 U/L — ABNORMAL HIGH (ref 0–44)
AST: 93 U/L — ABNORMAL HIGH (ref 15–41)
Albumin: 1.7 g/dL — ABNORMAL LOW (ref 3.5–5.0)
Alkaline Phosphatase: 145 U/L — ABNORMAL HIGH (ref 38–126)
Anion gap: 11 (ref 5–15)
BUN: 25 mg/dL — ABNORMAL HIGH (ref 8–23)
CO2: 23 mmol/L (ref 22–32)
Calcium: 8.1 mg/dL — ABNORMAL LOW (ref 8.9–10.3)
Chloride: 107 mmol/L (ref 98–111)
Creatinine, Ser: 2.25 mg/dL — ABNORMAL HIGH (ref 0.61–1.24)
GFR calc Af Amer: 33 mL/min — ABNORMAL LOW (ref >60)
GFR calc non Af Amer: 28 mL/min — ABNORMAL LOW (ref >60)
Glucose, Bld: 117 mg/dL — ABNORMAL HIGH (ref 70–99)
Potassium: 4 mmol/L (ref 3.5–5.1)
Sodium: 141 mmol/L (ref 135–145)
Total Bilirubin: 0.9 mg/dL (ref 0.3–1.2)
Total Protein: 5.4 g/dL — ABNORMAL LOW (ref 6.5–8.1)

## 2020-05-02 LAB — CBC
HCT: 29.4 % — ABNORMAL LOW (ref 39.0–52.0)
Hemoglobin: 9.1 g/dL — ABNORMAL LOW (ref 13.0–17.0)
MCH: 30.2 pg (ref 26.0–34.0)
MCHC: 31 g/dL (ref 30.0–36.0)
MCV: 97.7 fL (ref 80.0–100.0)
Platelets: 291 10*3/uL (ref 150–400)
RBC: 3.01 MIL/uL — ABNORMAL LOW (ref 4.22–5.81)
RDW: 14 % (ref 11.5–15.5)
WBC: 8.5 10*3/uL (ref 4.0–10.5)
nRBC: 0 % (ref 0.0–0.2)

## 2020-05-02 LAB — MAGNESIUM: Magnesium: 1.6 mg/dL — ABNORMAL LOW (ref 1.7–2.4)

## 2020-05-02 MED ORDER — AMLODIPINE BESYLATE 5 MG PO TABS: 5.0000 mg | ORAL_TABLET | Freq: Every day | ORAL | 11 refills | Status: AC

## 2020-05-02 MED ORDER — FERROUS SULFATE 325 (65 FE) MG PO TABS
325.0000 mg | ORAL_TABLET | Freq: Three times a day (TID) | ORAL | 3 refills | Status: AC
Start: 2020-05-02 — End: ?

## 2020-05-02 MED ORDER — ASPIRIN EC 81 MG PO TBEC: 81.0000 mg | DELAYED_RELEASE_TABLET | Freq: Every day | ORAL | 0 refills | Status: AC

## 2020-05-02 MED ORDER — FLUCONAZOLE 200 MG PO TABS: 200.0000 mg | ORAL_TABLET | Freq: Every day | ORAL | 0 refills | Status: AC

## 2020-05-02 MED ORDER — SACCHAROMYCES BOULARDII 250 MG PO CAPS
250.0000 mg | ORAL_CAPSULE | Freq: Two times a day (BID) | ORAL | Status: DC
  Administered 2020-05-02: 250 mg via ORAL
  Filled 2020-05-02: qty 1

## 2020-05-02 MED ORDER — PANTOPRAZOLE SODIUM 40 MG PO TBEC: 40.0000 mg | DELAYED_RELEASE_TABLET | Freq: Every day | ORAL | 1 refills | Status: DC

## 2020-05-02 MED ORDER — SACCHAROMYCES BOULARDII 250 MG PO CAPS: 250.0000 mg | ORAL_CAPSULE | Freq: Two times a day (BID) | ORAL | Status: DC

## 2020-05-02 MED ORDER — PANTOPRAZOLE SODIUM 40 MG PO TBEC: 40.0000 mg | DELAYED_RELEASE_TABLET | Freq: Every day | ORAL | Status: DC

## 2020-05-02 MED ORDER — CEFDINIR 300 MG PO CAPS
300.0000 mg | ORAL_CAPSULE | Freq: Two times a day (BID) | ORAL | Status: DC
  Filled 2020-05-02: qty 1

## 2020-05-02 MED ORDER — MAGNESIUM SULFATE 4 GM/100ML IV SOLN
4.0000 g | Freq: Once | INTRAVENOUS | Status: AC
  Administered 2020-05-02: 4 g via INTRAVENOUS
  Filled 2020-05-02: qty 100

## 2020-05-02 MED ORDER — CEFDINIR 300 MG PO CAPS: 300.0000 mg | ORAL_CAPSULE | Freq: Two times a day (BID) | ORAL | 0 refills | Status: AC

## 2020-05-02 NOTE — Discharge Summary (Signed)
Physician Discharge Summary  Donald Kim YQM:[id number] DOB: 28-Feb-1951 DOA: 04/27/2020  PCP: Tonye Becket., MD  Admit date: 04/27/2020 Discharge date: 05/02/2020  Time spent: 55 minutes  Recommendations for Outpatient Follow-up:  1. Follow-up with Dr. Michail Sermon, gastroenterology in 2 to 4 weeks.  On follow-up patient need a comprehensive metabolic profile done to follow-up on electrolytes, renal function, LFTs.  Patient also need an H&H done to follow-up on hemoglobin.  Biopsies from colonoscopy will need to be followed up upon. 2. Follow-up with Tonye Becket., MD in 2 weeks.  On follow-up patient will need a comprehensive metabolic profile done to follow-up on electrolytes and renal function.  Patient need a H&H done to follow-up on hemoglobin.  Patient blood pressure need to be reassessed as patient's ARB was discontinued on discharge.    Discharge Diagnoses:  Principal Problem:   GI bleed Active Problems:   Pyelonephritis of left kidney   Acute-on-chronic kidney injury ([place])   Transaminitis   Diarrhea   Positive fecal occult blood test   Iron deficiency anemia   Metabolic acidosis with normal anion gap and bicarbonate losses   Elevated LFTs   Urinary tract infection with hematuria   Candida esophagitis (HCC)   Symptomatic anemia   E. coli UTI   Discharge Condition: Stable and improved  Diet recommendation: Heart healthy  Filed Weights   04/30/20 1157 05/01/20 0449 05/02/20 0435  Weight: 124.7 kg 124.5 kg 126.6 kg    History of present illness:  HPI per Dr. Mylo Red Darling is a 69 y.o. male with medical history significant of HTN, HLD.  Pt sent in to ED from PCP for lab work which demonstrates a new anemia.  Pt has had progressively worsening generalized weakness and fatigue over past 2 weeks.  2 weeks ago he developed fevers to 104 for about 4 days.  COVID was neg.  Had associated steatorrhea and diarrhea with this.  Some nausea, no vomiting.  No abd pain,  no flank pain, no dysuria.  Diarrhea resolved, weakness persisted.  Pt went to PCP today.  PCP drew labs, sent to ED for evaluation of anemia.   ED Course: HGB 7.9, Creat 3.55 up from 1.7 in 2017, BUN 100, afebrile in ED.  HR 100-110 sinus.  SBP 93-114.  WBC 16.2k, AST 129 ALT 210.  UA with mod blood, mod LE, 6-10 wbs, 0 RBC.  Bicarb 15, AG 11.  Iron 16.  CT abd/pelvis results as noted below in note.  Hospital Course:  1 GI bleed/iron deficiency anemia/positive heme stools/hemorrhagic gastritis CT abdomen and pelvis which was done was concerning for lymph node noted.  CT abdomen and pelvis also done concerning for possible pyelonephritis, 2 mm nonobstructing left renal calculus, cholelithiasis without cholecystitis.  Patient noted on presentation to present with generalized weakness.  Hemoglobin on admission noted to be 7.9.  Patient received a unit of packed red blood cells and hemoglobin dropped further to 7.7.  Patient received another unit of packed red blood cells as hemoglobin dropped to 5.6.  2 more units of packed red blood cells were transfused (04/28/2020).  Hemoglobin currently at 9.1 this morning.  Patient did state has been having some intermittent black watery stools that he describes as licorice.  Patient denied any NSAID use.  No prior history of peptic ulcer disease.  Due to significant drop in hemoglobin to 5.6, after 2 units of packed red blood cells GI was consulted for emergent evaluation with endoscopy.  Patient underwent upper  endoscopy (04/28/2020), which showed Candida esophagitis with no bleeding, cells with cytology obtained, LA grade a reflux esophagitis with no bleeding, erosive gastropathy with stigmata of recent bleeding, acute gastritis, normal examined duodenum.  Patient underwent colonoscopy (04/30/2020) showed a sigmoid diverticulosis, small benign appearing colonic polyp status post removal, small internal hemorrhoids with no blood noted in the colon.  Patient  underwent capsule endoscopy which showed a few tiny vascular ectasias with no evidence of blood in the GI tract.  GI recommending patient remain off of aspirin for 1 to 2 weeks to allow resolution of minor hemorrhagic gastritis.  Patient also was started on IV fluconazole and transition to oral fluconazole for treatment of Candida esophagitis noted on upper endoscopy.  Patient was initially on a Protonix drip and has been transitioned to oral potassium daily.  Outpatient follow-up with PCP/GI.    2.  Candida esophagitis Noted on upper endoscopy.    Patient initially placed on IV fluconazole which he tolerated subsequently transition to oral fluconazole.  Patient be discharged on fluconazole 200 mg daily x10 more days to complete a 2-week course of treatment.  Outpatient follow-up with GI.    3.  Recent febrile illness/Acute pyelonephritis/E. coli UTI Concern for acute pyelonephritis noted on CT scan.  Urinalysis done on admission with concerns for UTI.  Urine cultures with >100,000 colonies of E. coli.  Blood cultures pending with no growth to date.    Patient maintained on IV Rocephin throughout the hospitalization and subsequently transition to [facility].  Patient will be discharged on Omnicef to complete a 10-day course of antibiotic treatment.    4.  Acute kidney injury on chronic kidney disease stage III/non-anion gap metabolic acidosis Baseline creatinine approximately 1.7.  Likely secondary to a prerenal azotemia in the setting of ARB.  ARB was held during the hospitalization and will be discontinued on discharge.  CT abdomen and pelvis which was done was negative for hydronephrosis.  Patient hydrated with IV fluids and also transfused packed red blood cells.  Renal function slowly trended down with creatinine at 2.25 by day of discharge from creatinine of 3.55 on admission.  Urinalysis worrisome for UTI.  Urine cultures with > 100,000 colonies of E. coli, pansensitive.  Bicarb drip discontinued.   Saline lock IV fluids.  Follow.    5.  Transaminitis Questionable etiology.  LFTs was initially trending down but fluctuated continue to trend down by day of discharge.  Acute hepatitis panel negative. CT abdomen and pelvis with cholelithiasis without cholecystitis, liver noted to be unremarkable.GI consulted and followed the patient throughout the hospitalization.  Patient will need repeat LFTs done in the outpatient setting.  Follow.    6.  Hypertension Patient's antihypertensive medications were held throughout the hospitalization.  Patient's ARB will be discontinued on discharge.  Patient be placed back on home regimen of amlodipine.  Outpatient follow-up with PCP.   7.  Hypomagnesemia Repleted.  Outpatient follow-up.   Procedures:  Upper endoscopy 04/28/2020  Transfusion of 4 units packed red blood cells 04/28/2020  CT abdomen and pelvis 04/27/2020  Colonoscopy 04/30/2020  Capsule endoscopy 04/30/2020  Consultations:  Gastroenterology: Dr. Michail Sermon 04/28/2020  PCCM: Dr. Tamala Julian 04/28/2020   Discharge Exam: Vitals:   05/02/20 0430 05/02/20 0817  BP: 123/84 123/79  Pulse: 78   Resp: 20 20  Temp: 98 F (36.7 C) 98.3 F (36.8 C)  SpO2: 96% 98%    General: NAD Cardiovascular: RRR Respiratory: CTAB  Discharge Instructions   Discharge Instructions  Diet - low sodium heart healthy   Complete by: As directed    Increase activity slowly   Complete by: As directed      Allergies as of 05/02/2020      Reactions   Penicillins Rash, Other (See Comments)   Childhood allergy   Xylocaine [lidocaine Hcl] Other (See Comments)   Muscles started contracting, jaws were grinding   Avelox [moxifloxacin Hcl In Nacl] Swelling, Rash   Avelox [moxifloxacin] Other (See Comments)   Reaction not recalled   Barley Grass Itching   Cabbage Other (See Comments)   Tested allergic   Epinephrine Other (See Comments)   Jaws locked   Polio Virus Vaccine Live Oral Trivalent Other (See  Comments)   Blacked out   Prednisone Other (See Comments)   Reaction??   Achromycin [tetracycline] Rash   Aureomycin [chlortetracycline] Other (See Comments)   Unknown reaction   Cocaine Other (See Comments)   Childhood allergy for medicinal purposes   Ilotycin [erythromycin] Other (See Comments)   Childhood allergy   Levaquin [levofloxacin In D5w] Hives, Other (See Comments)   "Crazy dreams," also   Lincomycin Other (See Comments)   Childhood allergy   Other Other (See Comments)   Compenamine, small pox vaccine - Childhood allergy   Thorazine [chlorpromazine] Other (See Comments)   Unknown reaction      Medication List    STOP taking these medications   Azor 5-40 MG tablet Generic drug: amLODipine-olmesartan     TAKE these medications   amLODipine 5 MG tablet Commonly known as: NORVASC Take 1 tablet (5 mg total) by mouth daily.   aspirin EC 81 MG tablet Take 1 tablet (81 mg total) by mouth daily. Start taking on: May 10, 2020 What changed: These instructions start on May 10, 2020. If you are unsure what to do until then, ask your doctor or other care provider.   atorvastatin 40 MG tablet Commonly known as: LIPITOR Take 40 mg by mouth at bedtime.   cefdinir 300 MG capsule Commonly known as: OMNICEF Take 1 capsule (300 mg total) by mouth every 12 (twelve) hours for 6 days.   CO Q 10 PO Take 1 capsule by mouth daily.   ferrous sulfate 325 (65 FE) MG tablet Take 1 tablet (325 mg total) by mouth 3 (three) times daily with meals.   fexofenadine-pseudoephedrine 60-120 MG 12 hr tablet Commonly known as: ALLEGRA-D Take 1 tablet by mouth 2 (two) times daily as needed (for allergic reactions).   FISH OIL PO Take 1 capsule by mouth daily.   FLAX SEED OIL PO Take 1 capsule by mouth daily.   fluconazole 200 MG tablet Commonly known as: DIFLUCAN Take 1 tablet (200 mg total) by mouth daily for 10 days.   fluticasone 50 MCG/ACT nasal spray Commonly known as:  FLONASE Place 1-2 sprays into both nostrils daily as needed for allergies or rhinitis.   GARLIC PO Take 1 tablet by mouth daily.   HYDROcodone-acetaminophen 7.5-325 MG tablet Commonly known as: NORCO Take 1 tablet by mouth 2 (two) times daily as needed (for knee pain).   MULTIVITAMIN PO Take 1 tablet by mouth daily.   pantoprazole 40 MG tablet Commonly known as: Protonix Take 1 tablet (40 mg total) by mouth daily.   saccharomyces boulardii 250 MG capsule Commonly known as: FLORASTOR Take 1 capsule (250 mg total) by mouth 2 (two) times daily.            Durable Medical Equipment  (From admission,  onward)         Start     Ordered   05/01/20 1158  For home use only DME 3 n 1  Once        05/01/20 1157         Allergies  Allergen Reactions  . Penicillins Rash and Other (See Comments)    Childhood allergy  . Xylocaine [Lidocaine Hcl] Other (See Comments)    Muscles started contracting, jaws were grinding  . Avelox [Moxifloxacin Hcl In Nacl] Swelling and Rash  . Avelox [Moxifloxacin] Other (See Comments)    Reaction not recalled  . Barley Grass Itching  . Cabbage Other (See Comments)    Tested allergic  . Epinephrine Other (See Comments)    Jaws locked  . Polio Virus Vaccine Live Oral Trivalent Other (See Comments)    Blacked out  . Prednisone Other (See Comments)    Reaction??  . Achromycin [Tetracycline] Rash  . Aureomycin [Chlortetracycline] Other (See Comments)    Unknown reaction  . Cocaine Other (See Comments)    Childhood allergy for medicinal purposes  . Ilotycin [Erythromycin] Other (See Comments)    Childhood allergy   . Levaquin [Levofloxacin In D5w] Hives and Other (See Comments)    "Crazy dreams," also  . Lincomycin Other (See Comments)    Childhood allergy  . Other Other (See Comments)    Compenamine, small pox vaccine - Childhood allergy  . Thorazine [Chlorpromazine] Other (See Comments)    Unknown reaction     Follow-up Information     Tonye Becket., MD. Schedule an appointment as soon as possible for a visit in 2 week(s).   Specialty: Family Medicine Contact information: [place] [address] [phone]        Wilford Corner, MD. Schedule an appointment as soon as possible for a visit in 2 week(s).   Specialty: Gastroenterology Why: f/u in 2-4 weeks. Contact information: [address] [place] [address] [phone]                The results of significant diagnostics from this hospitalization (including imaging, microbiology, ancillary and laboratory) are listed below for reference.    Significant Diagnostic Studies: CT ABDOMEN PELVIS WO CONTRAST  Result Date: 04/27/2020 CLINICAL DATA:  Generalized weakness, blood in stool, elevated LFTs EXAM: CT ABDOMEN AND PELVIS WITHOUT CONTRAST TECHNIQUE: Multidetector CT imaging of the abdomen and pelvis was performed following the standard protocol without IV contrast. COMPARISON:  02/03/2016, 04/27/2020 FINDINGS: Lower chest: No acute pleural or parenchymal lung disease. Hepatobiliary: Calcified gallstones are identified without cholecystitis. Liver is unremarkable. Pancreas: Unremarkable. No pancreatic ductal dilatation or surrounding inflammatory changes. Spleen: Normal in size without focal abnormality. Adrenals/Urinary Tract: There is left perinephric and Peri ureteral fat stranding. I do not see any evidence of hydronephrosis. There is a punctate less than 2 mm nonobstructing calculus left kidney image 39. The appearance could reflect infection or recent passage of a calculus. Please correlate with urinalysis. The right kidney is unremarkable. The adrenals are normal. Bladder is unremarkable. Stomach/Bowel: No bowel obstruction or ileus. Diverticulosis of the distal colon without diverticulitis. No wall thickening or inflammatory change. Normal appendix right lower quadrant. Vascular/Lymphatic: Aortic  atherosclerosis. There is a 2.2 by 2.5 cm low-attenuation structure along the left pelvic sidewall, just posterior to the left external iliac vein, reference image 84/3. This could reflect an enlarged lymph node. No other pathologic adenopathy. Reproductive: Prostate is unremarkable. Other: No free fluid  or free gas. Small fat containing umbilical hernia unchanged. Musculoskeletal: No acute or destructive bony lesions. Reconstructed images demonstrate no additional findings. IMPRESSION: 1. Left perinephric and periureteral fat stranding, without hydronephrosis. The appearance could reflect infection or recent passage of a calculus. Please correlate with urinalysis. 2. Punctate less than 2 mm nonobstructing left renal calculus. 3. Cholelithiasis without cholecystitis. 4. Diverticulosis without diverticulitis. 5. A 2.2 by 2.5 cm low-attenuation structure along the left pelvic sidewall, just posterior to the left external iliac vein, likely an enlarged lymph node. Etiology indeterminate. Please correlate with any history of known malignancy. Short interval follow-up CT or PET-CT could be considered. 6. Aortic Atherosclerosis (ICD10-I70.0). Electronically Signed   By: Randa Ngo M.D.   On: 04/27/2020 21:27   US Abdomen Limited RUQ  Result Date: 04/27/2020 CLINICAL DATA:  Elevated LFTs EXAM: ULTRASOUND ABDOMEN LIMITED RIGHT UPPER QUADRANT COMPARISON:  None. FINDINGS: Gallbladder: No gallstones or wall thickening visualized. No sonographic Murphy sign noted by sonographer. Common bile duct: Diameter: 5 mm Liver: Diffuse increased echogenicity with slightly heterogeneous liver. Appearance typically secondary to fatty infiltration. Fibrosis secondary consideration. No secondary findings of cirrhosis noted. No focal hepatic lesion or intrahepatic biliary duct dilatation. Portal vein is patent on color Doppler imaging with normal direction of blood flow towards the liver. Other: None. IMPRESSION: 1. No evidence for  cholelithiasis or acute cholecystitis. 2. Hepatic steatosis. Electronically Signed   By: Constance Holster M.D.   On: 04/27/2020 20:59    Microbiology: Recent Results (from the past 240 hour(s))  SARS Coronavirus 2 by RT PCR (hospital order, performed in [facility] hospital lab) Nasopharyngeal Nasopharyngeal Swab     Status: None   Collection Time: 04/27/20 10:01 PM   Specimen: Nasopharyngeal Swab  Result Value Ref Range Status   SARS Coronavirus 2 NEGATIVE NEGATIVE Final    Comment: (NOTE) SARS-CoV-2 target nucleic acids are NOT DETECTED. The SARS-CoV-2 RNA is generally detectable in upper and lower respiratory specimens during the acute phase of infection. The lowest concentration of SARS-CoV-2 viral copies this assay can detect is 250 copies / mL. A negative result does not preclude SARS-CoV-2 infection and should not be used as the sole basis for treatment or other patient management decisions.  A negative result may occur with improper specimen collection / handling, submission of specimen other than nasopharyngeal swab, presence of viral mutation(s) within the areas targeted by this assay, and inadequate number of viral copies (<250 copies / mL). A negative result must be combined with clinical observations, patient history, and epidemiological information. Fact Sheet for Patients:   StrictlyIdeas.no Fact Sheet for Healthcare Providers: BankingDealers.co.za This test is not yet approved or cleared  by the [place] FDA and has been authorized for detection and/or diagnosis of SARS-CoV-2 by FDA under an Emergency Use Authorization (EUA).  This EUA will remain in effect (meaning this test can be used) for the duration of the COVID-19 declaration under Section 564(b)(1) of the Act, 21 U.S.C. section 360bbb-3(b)(1), unless the authorization is terminated or revoked sooner. Performed at [facility], [place] [address]  [place], [place] [address]   Urine culture     Status: Abnormal   Collection Time: 04/27/20 10:10 PM   Specimen: Urine, Random  Result Value Ref Range Status   Specimen Description URINE, RANDOM  Final   Special Requests   Final    NONE Performed at [facility], [place] [address] [place], [place] [address]    Culture >=100,000 COLONIES/mL  ESCHERICHIA COLI (A)  Final   Report Status 04/29/2020 FINAL  Final   Organism ID, Bacteria ESCHERICHIA COLI (A)  Final      Susceptibility   Escherichia coli - MIC*    AMPICILLIN <=2 SENSITIVE Sensitive     CEFAZOLIN <=4 SENSITIVE Sensitive     CEFTRIAXONE <=1 SENSITIVE Sensitive     CIPROFLOXACIN <=0.25 SENSITIVE Sensitive     GENTAMICIN <=1 SENSITIVE Sensitive     IMIPENEM <=0.25 SENSITIVE Sensitive     NITROFURANTOIN <=16 SENSITIVE Sensitive     TRIMETH/SULFA <=20 SENSITIVE Sensitive     AMPICILLIN/SULBACTAM <=2 SENSITIVE Sensitive     PIP/TAZO <=4 SENSITIVE Sensitive     * >=100,000 COLONIES/mL ESCHERICHIA COLI  Culture, blood (routine x 2)     Status: None (Preliminary result)   Collection Time: 04/28/20  2:30 AM   Specimen: BLOOD LEFT HAND  Result Value Ref Range Status   Specimen Description BLOOD LEFT HAND  Final   Special Requests   Final    BOTTLES DRAWN AEROBIC AND ANAEROBIC Blood Culture adequate volume   Culture   Final    NO GROWTH 4 DAYS Performed at [facility], [address] [place], [place] [address]    Report Status PENDING  Incomplete  Culture, blood (routine x 2)     Status: None (Preliminary result)   Collection Time: 04/28/20  2:50 AM   Specimen: BLOOD RIGHT HAND  Result Value Ref Range Status   Specimen Description BLOOD RIGHT HAND  Final   Special Requests   Final    BOTTLES DRAWN AEROBIC AND ANAEROBIC Blood Culture results may not be optimal due to an excessive volume of blood received in culture bottles   Culture   Final    NO GROWTH 4 DAYS Performed at [facility], [place] [address],  [place], [place] [address]    Report Status PENDING  Incomplete  MRSA PCR Screening     Status: None   Collection Time: 04/28/20  4:00 PM   Specimen: Nasal Mucosa; Nasopharyngeal  Result Value Ref Range Status   MRSA by PCR NEGATIVE NEGATIVE Final    Comment:        The GeneXpert MRSA Assay (FDA approved for NASAL specimens only), is one component of a comprehensive MRSA colonization surveillance program. It is not intended to diagnose MRSA infection nor to guide or monitor treatment for MRSA infections. Performed at [facility], [place] [address] [place],  [address]      Labs: Basic Metabolic Panel: Recent Labs  Lab 04/28/20 0229 04/29/20 0110 04/30/20 0411 05/01/20 0245 05/02/20 0507  NA 144 143 142 144 141  K 4.9 4.6 4.2 4.2 4.0  CL 117* 108 108 109 107  CO2 17* 24 24 22 23   GLUCOSE 121* 118* 108* 101* 117*  BUN 90* 64* 41* 32* 25*  CREATININE 3.27* 2.88* 2.65* 2.38* 2.25*  CALCIUM 8.5* 8.0* 7.9* 7.8* 8.1*  MG  --  1.6* 2.2 1.9 1.6*   Liver Function Tests: Recent Labs  Lab 04/28/20 0229 04/29/20 0110 04/30/20 0411 05/01/20 0245 05/02/20 0507  AST 121* 85* 118* 117* 93*  ALT 192* 142* 146* 133* 124*  ALKPHOS 173* 163* 166* 160* 145*  BILITOT 1.1 0.8 0.6 0.5 0.9  PROT 6.2* 5.6* 5.7* 5.5* 5.4*  ALBUMIN 2.0* 1.8* 1.8* 1.8* 1.7*   No results for input(s): LIPASE, AMYLASE in the last 168 hours. No results for input(s): AMMONIA in the last 168  hours. CBC: Recent Labs  Lab 04/28/20 0229 04/28/20 1115 04/29/20 0110 04/29/20 0411 04/30/20 0411 04/30/20 0411 04/30/20 1139 04/30/20 2017 05/01/20 0245 05/01/20 1315 05/02/20 0507  WBC 15.0*  --  13.9*  --  11.3*  --   --   --  10.4  --  8.5  HGB 7.7*   < > 9.8*  9.7*   < > 10.2*   < > 10.0* 9.4* 9.2* 9.3* 9.1*  HCT 24.7*   < > 30.2*  29.5*   < > 32.1*   < > 32.2* 30.0* 29.1* 29.9* 29.4*  MCV 96.5  --  92.9  --  95.5  --   --   --  97.3  --  97.7  PLT 434*  --  400  --  392  --   --   --  318   --  291   < > = values in this interval not displayed.   Cardiac Enzymes: Recent Labs  Lab 04/28/20 0229  CKTOTAL 48*   BNP: BNP (last 3 results) No results for input(s): BNP in the last 8760 hours.  ProBNP (last 3 results) No results for input(s): PROBNP in the last 8760 hours.  CBG: No results for input(s): GLUCAP in the last 168 hours.     Signed:  Irine Seal MD.  [facility] 05/02/2020, 3:43 PM

## 2020-05-02 NOTE — Progress Notes (Signed)
Donald Kim to be D/C'd Home per MD order.  Discussed with the patient and all questions fully answered.  VSS, Skin clean, dry and intact without evidence of skin break down, no evidence of skin tears noted. IV catheter discontinued intact. Site without signs and symptoms of complications. Dressing and pressure applied.  An After Visit Summary was printed and given to the patient. Patient received prescription.  D/c education completed with patient/family including follow up instructions, medication list, d/c activities limitations if indicated, with other d/c instructions as indicated by MD - patient able to verbalize understanding, all questions fully answered.   Patient instructed to return to ED, call 911, or call MD for any changes in condition.   Patient escorted via [place], and D/C home via private auto.  [name] 05/02/2020 1:12 PM

## 2020-05-02 NOTE — Evaluation (Addendum)
Occupational Therapy Evaluation Patient Details Name: Donald Kim MRN: [id number] DOB: [birth year] Today's Date: 05/02/2020    History of Present Illness Donald Kim is a 69 y.o. male with medical history significant of HTN, HLD, sent in to ED from PCP for lab work which demonstrates a new anemia. Pt has had progressively worsening generalized weakness and fatigue over past 2 weeks leading up to this admission, had fever (covid neg); Upper GI endoscopy revealed Sigmoid diverticulosis. Small benign-appearing colon polyp removed with a hot snare. Small internal hemorrhoids. No blood in colon; Capsule endoscopy basically neg   Clinical Impression   This 69 y/o male presents with the above. Pt pleasant and willing to participate in therapy session today. Overall pt completing ADL and mobility tasks within room without AD at distant supervision - mod independent level. Pt grossly with 2/4 DOE with standing activity, SpO2 >90% on RA and max HR noted 119bpm. Pt lives alone but reports has good support from family/friends PRN after discharge. Will continue to follow while pt acutely admitted to further address energy conservation strategies in relation to functional tasks, however do not anticipate pt will require follow up OT services after discharge.     Follow Up Recommendations  No OT follow up;Supervision - Intermittent    Equipment Recommendations  None recommended by OT (pt has friend who will loan shower seat)           Precautions / Restrictions Precautions Precautions: None Restrictions Weight Bearing Restrictions: No      Mobility Bed Mobility               General bed mobility comments: pt OOB upon arrival, ended session with pt seated in recliner   Transfers Overall transfer level: Modified independent Equipment used: None                  Balance Overall balance assessment: No apparent balance deficits (not formally assessed)                                          ADL either performed or assessed with clinical judgement   ADL Overall ADL's : At baseline                                       General ADL Comments: pt up in bathroom upon arrival to room, during session he demonstrated LB ADL, standing grooming ADL and room level mobility without AD at distant supervision - mod independent level. initated dicussion of activity progression and energy conservation after return home given hospital course/stay and pt verbalizing understanding. Discussed option of 3:1 vs shower seat (for use in shower as means of conserving energy) - pt with preference for shower seat at this time                          Pertinent Vitals/Pain Pain Assessment: No/denies pain     Hand Dominance     Extremity/Trunk Assessment Upper Extremity Assessment Upper Extremity Assessment: Overall WFL for tasks assessed   Lower Extremity Assessment Lower Extremity Assessment: Defer to PT evaluation       Communication Communication Communication: No difficulties   Cognition Arousal/Alertness: Awake/alert Behavior During Therapy: WFL for tasks assessed/performed Overall Cognitive Status: Within Functional Limits for tasks  assessed                                     General Comments  HR up to 119 with activity, SpO2 >90% on RA, DOE grossly 2/4 with standing activity     Exercises     Shoulder Instructions      Home Living Family/patient expects to be discharged to:: Private residence Living Arrangements: Alone Available Help at Discharge: Friend(s);Family;Available PRN/intermittently Type of Home: House Home Access: Stairs to enter [employer] of Steps: 3 Entrance Stairs-Rails:  (has a "stoop" he uses for UE support) Home Layout: One level     Bathroom Shower/Tub: Teacher, early years/pre: Standard     Home Equipment: None          Prior Functioning/Environment Level  of Independence: Independent        Comments: retired, still driving, enjoys hunting/fishing        OT Problem List: Decreased activity tolerance;Cardiopulmonary status limiting activity;Obesity;Decreased knowledge of use of DME or AE      OT Treatment/Interventions: Self-care/ADL training;Therapeutic exercise;Energy conservation;DME and/or AE instruction;Therapeutic activities;Patient/family education;Balance training    OT Goals(Current goals can be found in the care plan section) Acute Rehab OT Goals Patient Stated Goal: home soon OT Goal Formulation: With patient Time For Goal Achievement: 05/16/20 Potential to Achieve Goals: Good  OT Frequency: Min 2X/week   Barriers to D/C:            Co-evaluation              AM-PAC OT "6 Clicks" Daily Activity     Outcome Measure Help from another person eating meals?: None Help from another person taking care of personal grooming?: None Help from another person toileting, which includes using toliet, bedpan, or urinal?: None Help from another person bathing (including washing, rinsing, drying)?: None Help from another person to put on and taking off regular upper body clothing?: None Help from another person to put on and taking off regular lower body clothing?: None 6 Click Score: 24   End of Session Nurse Communication: Mobility status  Activity Tolerance: Patient tolerated treatment well Patient left: in chair;with call bell/phone within reach  OT Visit Diagnosis: Other (comment) (decreased activity tolerance )                Time: 8550-1586 OT Time Calculation (min): 25 min Charges:  OT General Charges $OT Visit: 1 Visit OT Evaluation $OT Eval Moderate Complexity: 1 Mod  Donald Kim, OT Acute Rehabilitation Services Pager [phone] Office [phone]   Donald Kim 05/02/2020, 9:41 AM

## 2020-05-02 NOTE — Progress Notes (Signed)
Problems:  1.  GI bleed of unclear origin.  Capsule study, as noted yesterday, was essentially unrevealing, with just a few tiny vascular ectasia and no evidence of blood in the GI tract.  Hemoglobin remained stable today at 9.1.  Per discussion with Dr. Grandville Silos yesterday afternoon, would favor remaining off aspirin for 1 to 2 weeks to allow resolution of the minor hemorrhagic gastritis noted endoscopically, and more especially, to allow clot maturation in the event that some unseen lesion has achieved hemostasis.  Small stool yesterday, not melenic per patient.  2.  Hemorrhagic gastritis.  I reviewed the patient's endoscopic photographs.  He appears to have nonerosive mucosal hemorrhages, which are basically trivial and would be seen in many people undergoing endoscopy, especially those recently on aspirin.  The observed findings would not likely account for the patient's significant GI bleed.  3.  Candida esophagitis.  This looked moderately severe, and I agree with Dr. Biagio Borg plan to treat with Diflucan for a couple of weeks.  4.  Elevated liver chemistries.  These continue to decline as of today's blood work, currently in the 100 range.  They presumably reflect "reactive hepatopathy" related to the patient's pyelonephritis.  Recommendations:  1.  Okay for discharge from the GI tract standpoint.  No special dietary instructions needed.  2.  I would maintain the patient on once daily PPI therapy indefinitely, especially if he remains on aspirin going forward  3.  Remain off aspirin for 1 - 2 weeks, then okay to resume  4.  Diflucan for 2 weeks  5.  Would arrange follow-up in our office in several weeks following discharge to monitor liver chemistries and Hemoccult status-- Pt was given our card and advised to f/u with Dr. Michail Sermon.  6. Will sign off--- call if needed.   Cleotis Nipper, M.D. Pager [phone] If no answer or after 5 PM call [phone]

## 2020-05-03 LAB — CULTURE, BLOOD (ROUTINE X 2)
Culture: NO GROWTH
Culture: NO GROWTH
Special Requests: ADEQUATE

## 2020-05-03 LAB — SURGICAL PATHOLOGY

## 2021-01-11 IMAGING — CT CT ABD-PELV W/O CM
2 of 4 series · 16 of 46 positions shown, 18 images · non-contrast
Comparison: 02/03/2016, 04/27/2020

CLINICAL DATA: Generalized weakness, blood in stool, elevated LFTs

EXAM:
CT ABDOMEN AND PELVIS WITHOUT CONTRAST
TECHNIQUE: Multidetector CT imaging of the abdomen and pelvis was performed
following the standard protocol without IV contrast.

[Series 3: a/p w/o 5mm · axial · non-contrast · 0.93mm/px · z∈[-512,-42]mm · 13 of 104 slices shown, 15 images]
[im 5/104  soft-tissue]
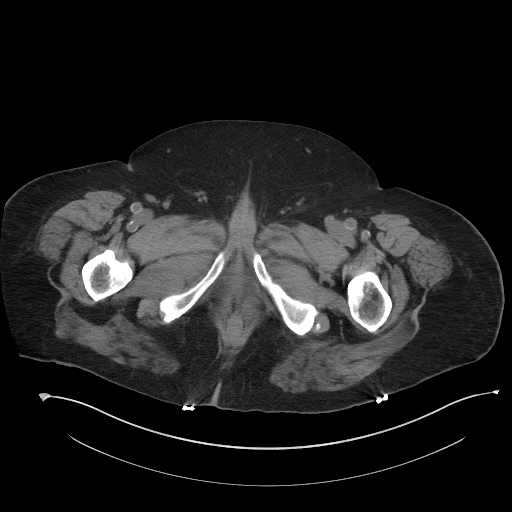
[im 5/104  bone]
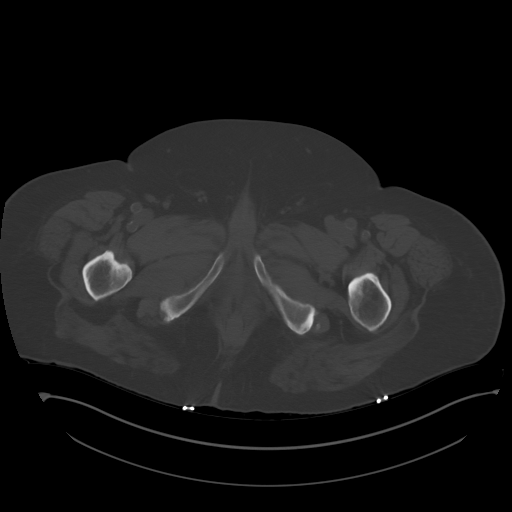
[im 15/104  soft-tissue]
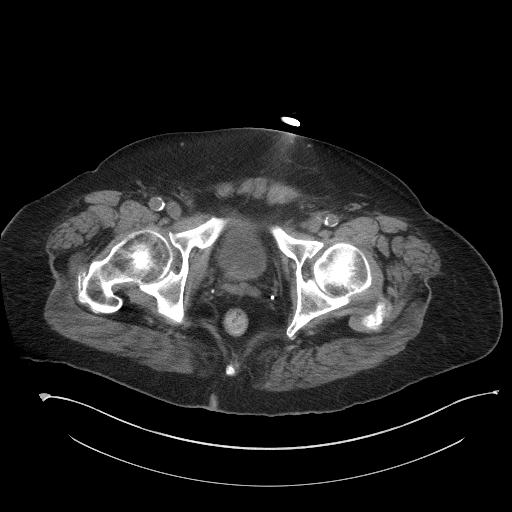
[im 20/104  soft-tissue]
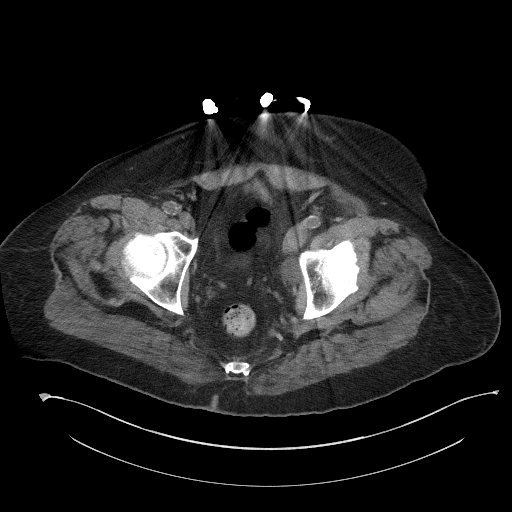
[im 30/104  soft-tissue]
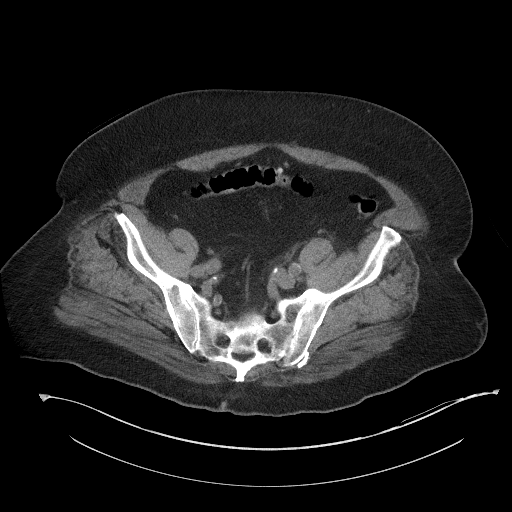
[im 35/104  soft-tissue]
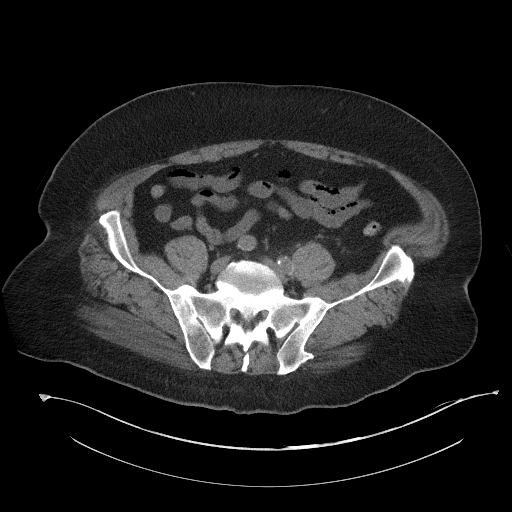
[im 45/104  soft-tissue]
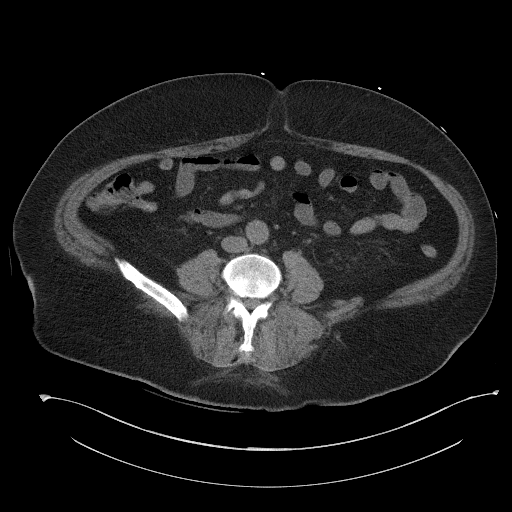
[im 54/104  soft-tissue]
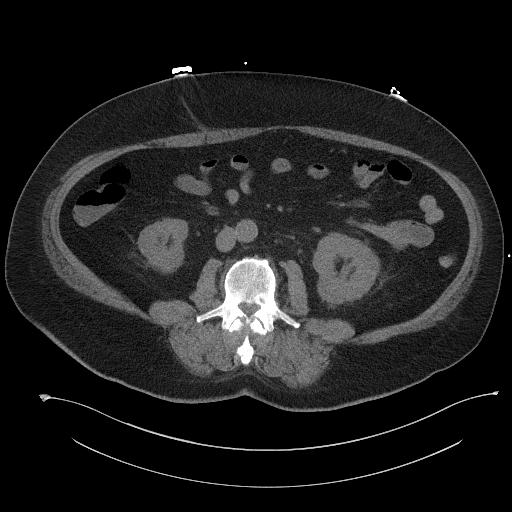
[im 59/104  soft-tissue]
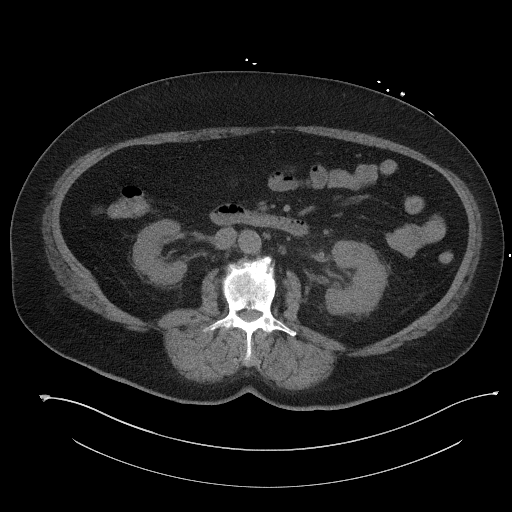
[im 69/104  soft-tissue]
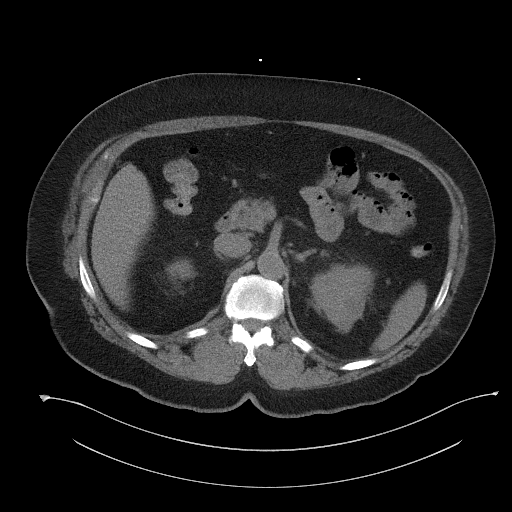
[im 69/104  bone]
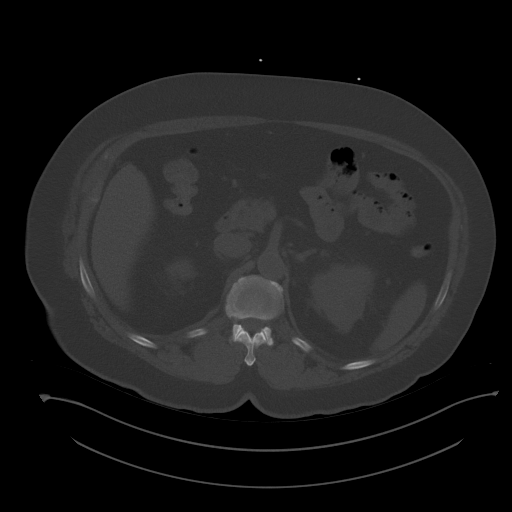
[im 74/104  soft-tissue]
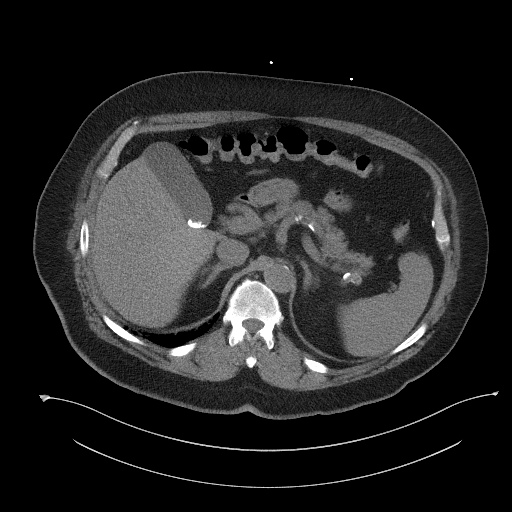
[im 84/104  soft-tissue]
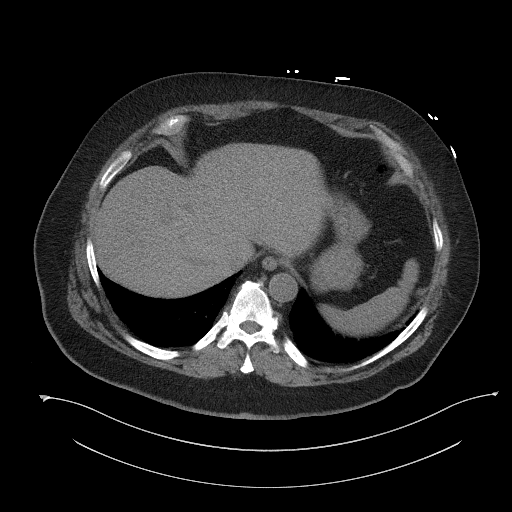
[im 89/104  soft-tissue]
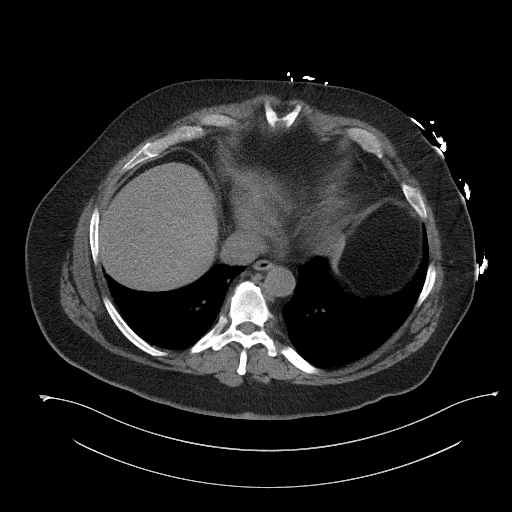
[im 99/104  soft-tissue]
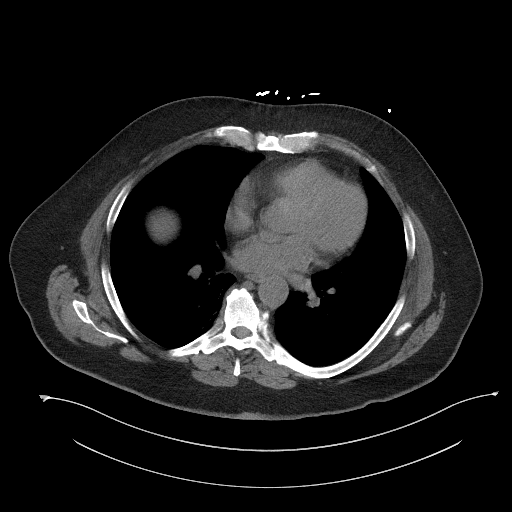

[Series 6: a/p w/o cor · coronal · non-contrast · 0.94mm/px · 3 of 163 slices shown]
[im 55/163  soft-tissue]
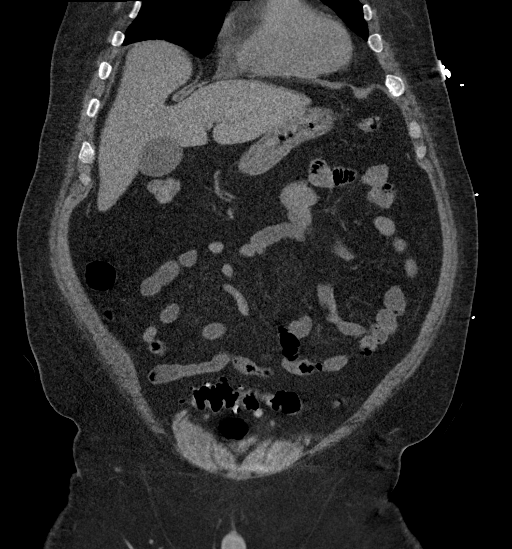
[im 73/163  soft-tissue]
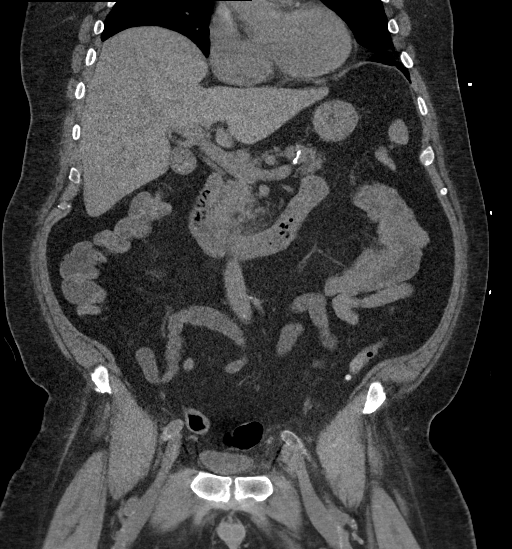
[im 91/163  soft-tissue]
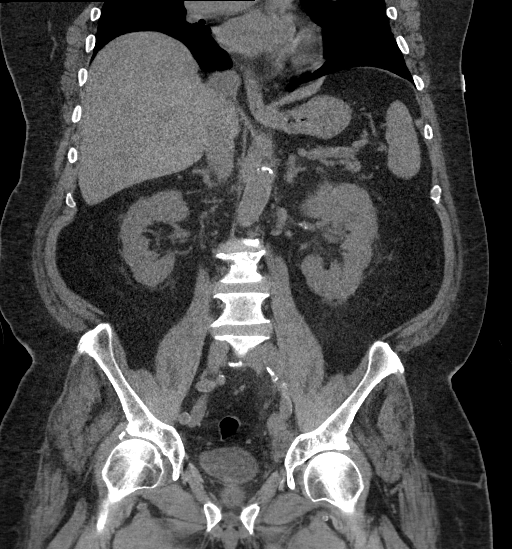

[16 of 46 positions shown; findings below may reference images not displayed]

FINDINGS: Lower chest: No acute pleural or parenchymal lung disease.

Hepatobiliary: Calcified gallstones are identified without
cholecystitis. Liver is unremarkable.

Pancreas: Unremarkable. No pancreatic ductal dilatation or
surrounding inflammatory changes.

Spleen: Normal in size without focal abnormality.

Adrenals/Urinary Tract: There is left perinephric and Peri ureteral
fat stranding. I do not see any evidence of hydronephrosis. There is
a punctate less than 2 mm nonobstructing calculus left kidney image
39. The appearance could reflect infection or recent passage of a
calculus. Please correlate with urinalysis.

The right kidney is unremarkable. The adrenals are normal. Bladder
is unremarkable.

Stomach/Bowel: No bowel obstruction or ileus. Diverticulosis of the
distal colon without diverticulitis. No wall thickening or
inflammatory change. Normal appendix right lower quadrant.

Vascular/Lymphatic: Aortic atherosclerosis. There is a 2.2 by 2.5 cm
low-attenuation structure along the left pelvic sidewall, just
posterior to the left external iliac vein, reference image 84/3.
This could reflect an enlarged lymph node. No other pathologic
adenopathy.

Reproductive: Prostate is unremarkable.

Other: No free fluid or free gas. Small fat containing umbilical
hernia unchanged.

Musculoskeletal: No acute or destructive bony lesions. Reconstructed
images demonstrate no additional findings.
IMPRESSION: 1. Left perinephric and periureteral fat stranding, without
hydronephrosis. The appearance could reflect infection or recent
passage of a calculus. Please correlate with urinalysis.
2. Punctate less than 2 mm nonobstructing left renal calculus.
3. Cholelithiasis without cholecystitis.
4. Diverticulosis without diverticulitis.
5. A 2.2 by 2.5 cm low-attenuation structure along the left pelvic
sidewall, just posterior to the left external iliac vein, likely an
enlarged lymph node. Etiology indeterminate. Please correlate with
any history of known malignancy. Short interval follow-up CT or
PET-CT could be considered.
6. Aortic Atherosclerosis ([id number]).

## 2024-06-16 NOTE — Telephone Encounter (Signed)
 Patient returned our call, please give him a call back later in the day.

## 2024-08-04 NOTE — Progress Notes (Unsigned)
 Cardiology Office Note Date:  08/06/2024  ID:  Donald Kim, DOB Jun 07, 1951, MRN [id number] PCP:  Chrystie Debby CROME., MD  Cardiologist: Joelle VEAR Ren Donley, MD  No chief complaint on file.     Problems Irregular HR w/ PVCs ECG w/ high PVC burden Atherosclerosis on CT Pre-DM HA1C 5.7 7/25 HTN on AE10 HLD on AN40 CKD3  Visits  09/17: metop XL 25, TTE, TSH, ziopatch 48 hrs and follow up 3 months.    History of Present Illness: Donald Kim is a 73 y.o. male who presents for new visit.  He was found to have abnormal ECG in PCP office and sent here for irregular HR. He denies any dyspnea or CP w/ exertion. He also denies any chest palpitations, orthopnea, PND or LE edema.  ROS: Please see the history of present illness. All other systems are reviewed and negative.   Past Medical History:  Diagnosis Date   Hyperlipidemia    Hypertension     Past Surgical History:  Procedure Laterality Date   ANKLE SURGERY     COLONOSCOPY WITH PROPOFOL  N/A 04/30/2020   Procedure: COLONOSCOPY WITH PROPOFOL ;  Surgeon: Dianna Specking, MD;  Location: [facility] ENDOSCOPY;  Service: Endoscopy;  Laterality: N/A;   ESOPHAGEAL BRUSHING  04/28/2020   Procedure: ESOPHAGEAL BRUSHING;  Surgeon: Dianna Specking, MD;  Location: [facility] ENDOSCOPY;  Service: Endoscopy;;   ESOPHAGOGASTRODUODENOSCOPY N/A 04/28/2020   Procedure: ESOPHAGOGASTRODUODENOSCOPY (EGD);  Surgeon: Dianna Specking, MD;  Location: [facility] ENDOSCOPY;  Service: Endoscopy;  Laterality: N/A;   KNEE SURGERY     NOSE SURGERY     POLYPECTOMY  04/30/2020   Procedure: POLYPECTOMY;  Surgeon: Dianna Specking, MD;  Location: [facility] ENDOSCOPY;  Service: Endoscopy;;    Current Outpatient Medications  Medication Sig Dispense Refill   amLODipine  (NORVASC ) 5 MG tablet Take 1 tablet (5 mg total) by mouth daily. 30 tablet 11   aspirin  EC 81 MG tablet Take 1 tablet (81 mg total) by mouth daily. (Patient taking differently: Take 81 mg by mouth 3 (three) times a week.  Taking 2-3 times a week) 30 tablet 0   atorvastatin  (LIPITOR) 40 MG tablet Take 40 mg by mouth at bedtime.     Flaxseed, Linseed, (FLAX SEED OIL PO) Take 1 capsule by mouth daily.      ferrous sulfate  325 (65 FE) MG tablet Take 1 tablet (325 mg total) by mouth 3 (three) times daily with meals. (Patient not taking: Reported on 08/06/2024)  3   Multiple Vitamins-Minerals (MULTIVITAMIN PO) Take 1 tablet by mouth daily. (Patient not taking: Reported on 08/06/2024)     pantoprazole  (PROTONIX ) 40 MG tablet Take 1 tablet (40 mg total) by mouth daily. (Patient not taking: Reported on 08/06/2024) 30 tablet 1   No current facility-administered medications for this visit.    Allergies:   Penicillins, Xylocaine [lidocaine hcl], Avelox [moxifloxacin hcl in nacl], Avelox [moxifloxacin], Barley grass, Cabbage, Epinephrine, Polio virus vaccine live oral trivalent, Prednisone , Achromycin [tetracycline], Aureomycin [chlortetracycline], Cocaine, Ilotycin [erythromycin], Levaquin [levofloxacin in d5w], Lincomycin, Other, and Thorazine [chlorpromazine]   Social History:  No pertinent Hx  Family History:  No pertinent Hx  PHYSICAL EXAM: VS:  BP 110/68   Pulse (!) 103   Ht 5' 10 (1.778 m)   Wt 245 lb 6.4 oz (111.3 kg)   SpO2 98%   BMI 35.21 kg/m  , BMI Body mass index is 35.21 kg/m. 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 male who presents for new visit.  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 ECG today showing Bigeminy pattern w/ possible RVOT origin. Will obtain TSH, TTE, 72 hr ziopatch  and start metop XL 25 mg daily. If evidence of structural heart disease or persistent burden despite medical therapy, can consider referral to EP for ablation.    Signed, Joelle VEAR Ren Donley, MD  08/06/2024 10:34 AM    [facility]

## 2024-08-06 ENCOUNTER — Ambulatory Visit: Attending: Cardiology

## 2024-08-06 ENCOUNTER — Ambulatory Visit

## 2024-08-06 VITALS — BP 110/68 | HR 103 | Ht 70.0 in | Wt 245.4 lb

## 2024-08-06 DIAGNOSIS — I499 Cardiac arrhythmia, unspecified: Secondary | ICD-10-CM

## 2024-08-06 DIAGNOSIS — I493 Ventricular premature depolarization: Secondary | ICD-10-CM

## 2024-08-06 DIAGNOSIS — I10 Essential (primary) hypertension: Secondary | ICD-10-CM

## 2024-08-06 DIAGNOSIS — N1831 Chronic kidney disease, stage 3a: Secondary | ICD-10-CM

## 2024-08-06 DIAGNOSIS — R7303 Prediabetes: Secondary | ICD-10-CM | POA: Diagnosis not present

## 2024-08-06 DIAGNOSIS — Z6835 Body mass index (BMI) 35.0-35.9, adult: Secondary | ICD-10-CM

## 2024-08-06 DIAGNOSIS — E66812 Obesity, class 2: Secondary | ICD-10-CM

## 2024-08-06 DIAGNOSIS — E785 Hyperlipidemia, unspecified: Secondary | ICD-10-CM

## 2024-08-06 DIAGNOSIS — E6609 Other obesity due to excess calories: Secondary | ICD-10-CM

## 2024-08-06 MED ORDER — METOPROLOL SUCCINATE ER 25 MG PO TB24
25.0000 mg | ORAL_TABLET | Freq: Every day | ORAL | 3 refills | Status: DC
Start: 2024-08-06 — End: 2024-08-11

## 2024-08-06 NOTE — Progress Notes (Unsigned)
 Enrolled for [employer] to mail a ZIO XT long term holter monitor to the patients address on file.

## 2024-08-06 NOTE — Patient Instructions (Signed)
 Medication Instructions:  Your physician has recommended you make the following change in your medication:  START Metoprolol  Succinate (Toprol ) 25 mg once daily at bedtime  *If you need a refill on your cardiac medications before your next appointment, please call your pharmacy*  Lab Work: Today: TSH   If you have any lab test that is abnormal or we need to change your treatment, we will call you to review the results.  Testing/Procedures: Your physician has requested that you have an echocardiogram. Echocardiography is a painless test that uses sound waves to create images of your heart. It provides your doctor with information about the size and shape of your heart and how well your heart's chambers and valves are working. This procedure takes approximately one hour. There are no restrictions for this procedure. Please do NOT wear cologne, perfume, aftershave, or lotions (deodorant is allowed). Please arrive 15 minutes prior to your appointment time.  Please note: We ask at that you not bring children with you during ultrasound (echo/ vascular) testing. Due to room size and safety concerns, children are not allowed in the ultrasound rooms during exams. Our front office staff cannot provide observation of children in our lobby area while testing is being conducted. An adult accompanying a patient to their appointment will only be allowed in the ultrasound room at the discretion of the ultrasound technician under special circumstances. We apologize for any inconvenience.                            \  ZIO XT- Long Term Monitor Instructions  Your physician has requested you wear a ZIO patch monitor for 2 days.  This is a single patch monitor. [employer] supplies one patch monitor per enrollment. Additional stickers are not available. Please do not apply patch if you will be having a Nuclear Stress Test,  Echocardiogram, Cardiac CT, MRI, or Chest Xray during the period you would be wearing the   monitor. The patch cannot be worn during these tests. You cannot remove and re-apply the  ZIO XT patch monitor.  Your ZIO patch monitor will be mailed 3 day USPS to your address on file. It may take 3-5 days  to receive your monitor after you have been enrolled.  Once you have received your monitor, please review the enclosed instructions. Your monitor  has already been registered assigning a specific monitor serial # to you.  Billing and Patient Assistance Program Information  We have supplied [employer] with any of your insurance information on file for billing purposes. [employer] offers a sliding scale Patient Assistance Program for patients that do not have  insurance, or whose insurance does not completely cover the cost of the ZIO monitor.  You must apply for the Patient Assistance Program to qualify for this discounted rate.  To apply, please call [employer] at [phone], select option 4, select option 2, ask to apply for  Patient Assistance Program. Meredeth will ask your household income, and how many people  are in your household. They will quote your out-of-pocket cost based on that information.  [employer] will also be able to set up a 94-month, interest-free payment plan if needed.  Applying the monitor   Shave hair from upper left chest.  Hold abrader disc by orange tab. Rub abrader in 40 strokes over the upper left chest as  indicated in your monitor instructions.  Clean area with 4 enclosed alcohol pads. Let dry.  Apply patch  as indicated in monitor instructions. Patch will be placed under collarbone on left  side of chest with arrow pointing upward.  Rub patch adhesive wings for 2 minutes. Remove white label marked 1. Remove the white  label marked 2. Rub patch adhesive wings for 2 additional minutes.  While looking in a mirror, press and release button in center of patch. A small green light will  flash 3-4 times. This will be your only indicator that the monitor has been  turned on.  Do not shower for the first 24 hours. You may shower after the first 24 hours.  Press the button if you feel a symptom. You will hear a small click. Record Date, Time and  Symptom in the Patient Logbook.  When you are ready to remove the patch, follow instructions on the last 2 pages of Patient  Logbook. Stick patch monitor onto the last page of Patient Logbook.  Place Patient Logbook in the blue and white box. Use locking tab on box and tape box closed  securely. The blue and white box has prepaid postage on it. Please place it in the mailbox as  soon as possible. Your physician should have your test results approximately 7 days after the  monitor has been mailed back to [facility].  Call [facility] Customer Care at [phone] if you have questions regarding  your ZIO XT patch monitor. Call them immediately if you see an orange light blinking on your  monitor.  If your monitor falls off in less than 4 days, contact our Monitor department at [phone].  If your monitor becomes loose or falls off after 4 days call [employer] at [phone] for  suggestions on securing your monitor   Follow-Up: At [facility], you and your health needs are our priority.  As part of our continuing mission to provide you with exceptional heart care, our providers are all part of one team.  This team includes your primary Cardiologist (physician) and Advanced Practice Providers or APPs (Physician Assistants and Nurse Practitioners) who all work together to provide you with the care you need, when you need it.  Your next appointment:   3 month(s)  Provider:   Dr. Ren   We recommend signing up for the patient portal called MyChart.  Sign up information is provided on this After Visit Summary.  MyChart is used to connect with patients for Virtual Visits (Telemedicine).  Patients are able to view lab/test results, encounter notes, upcoming appointments, etc.  Non-urgent  messages can be sent to your provider as well.   To learn more about what you can do with MyChart, go to [email or web address].   Thank you for choosing [facility]!!   [phone]   Other Instructions   Metoprolol  Extended-Release Tablets What is this medication? METOPROLOL  (me TOE proe lole) treats high blood pressure and heart failure. It may also be used to prevent chest pain (angina). It works by lowering your blood pressure and heart rate, making it easier for your heart to pump blood to the rest of your body. It belongs to a group of medications called beta blockers. This medicine may be used for other purposes; ask your health care provider or pharmacist if you have questions. COMMON BRAND NAME(S): toprol , Toprol  XL, Toprol -XL What should I tell my care team before I take this medication? They need to know if you have any of these conditions: Diabetes Heart or blood vessel conditions, such as slow heartbeat, worsening heart failure, heart block,  sick sinus syndrome Liver disease Lung or breathing disease, such as asthma or COPD Pheochromocytoma Thyroid disease An unusual or allergic reaction to metoprolol , other medications, foods, dyes, or preservatives Pregnant or trying to get pregnant Breastfeeding How should I use this medication? Take this medication by mouth with water . Take it as directed on the prescription label at the same time every day. You may cut the tablet in half if it is scored (has a line in the middle of it). This may help you swallow the tablet if the whole tablet is too big. Be sure to take both halves. Do not take just one-half of the tablet. You can take it with or without food. It is best to take it with food or after a meal. You should always take it the same way. Keep taking it unless your care team tells you to stop. Talk to your care team about the use of this medication in children. While it may be prescribed for children as young as 6 years  for selected conditions, precautions do apply. Overdosage: If you think you have taken too much of this medicine contact a poison control center or emergency room at once. NOTE: This medicine is only for you. Do not share this medicine with others. What if I miss a dose? If you miss a dose, take it as soon as you can. If it is almost time for your next dose, take only that dose. Do not take double or extra doses. What may interact with this medication? Epinephrine MAOIs, such as Marplan, Nardil, and Parnate NSAIDS, medications for pain and inflammation, such as ibuprofen or naproxen Some medications for blood pressure, heart disease, irregular heartbeat Other medications may affect the way this medication works. Talk with your care team about all the medications you take. They may suggest changes to your treatment plan to lower the risk of side effects and to make sure your medications work as intended. This list may not describe all possible interactions. Give your health care provider a list of all the medicines, herbs, non-prescription drugs, or dietary supplements you use. Also tell them if you smoke, drink alcohol, or use illegal drugs. Some items may interact with your medicine. What should I watch for while using this medication? Visit your care team for regular checks on your progress. Check your blood pressure as directed. Know what your blood pressure should be and when to contact your care team. Taking this medication is only part of a total heart healthy program. Ask your care team if there are other changes you can make to improve your overall health. Do not treat yourself for coughs, colds, or pain while you are using this medication without asking your care team for advice. Some medications may increase your blood pressure. This medication may affect your coordination, reaction time, or judgment. Do not drive or operate machinery until you know how this medication affects you. Sit up  or stand slowly to reduce the risk of dizzy or fainting spells. Drinking alcohol with this medication can increase the risk of these side effects. This medication may affect blood glucose levels. It can also mask the symptoms of low blood sugar, such as a rapid heartbeat and tremors. If you have diabetes, it is important to check your blood sugar often while you are taking this medication. Do not suddenly stop taking this medication. This may increase your risk of side effects, such as chest pain and heart attack. If you no longer need to  take this medication, your care team will lower the dose slowly over time to decrease the risk of side effects. If you are going to need surgery or a procedure, tell your care team that you are using this medication. What side effects may I notice from receiving this medication? Side effects that you should report to your care team as soon as possible: Allergic reactions--skin rash, itching, hives, swelling of the face, lips, tongue, or throat Heart failure--shortness of breath, swelling of the ankles, feet, or hands, sudden weight gain, unusual weakness or fatigue Low blood pressure--dizziness, feeling faint or lightheaded, blurry vision Raynaud's--cool, numb, or painful fingers or toes that may change color from pale, to blue, to red Slow heartbeat--dizziness, feeling faint or lightheaded, confusion, trouble breathing, unusual weakness or fatigue Worsening mood, feelings of depression Side effects that usually do not require medical attention (report to your care team if they continue or are bothersome): Change in sex drive or performance Diarrhea Dizziness Fatigue Headache This list may not describe all possible side effects. Call your doctor for medical advice about side effects. You may report side effects to FDA at 1-800-FDA-1088. Where should I keep my medication? Keep out of the reach of children and pets. Store at room temperature between 20 and 25  degrees C (68 and 77 degrees F). Get rid of any unused medication after the expiration date. To get rid of medications that are no longer needed or have expired: Take the medication to a take-back program. Check with your pharmacy or law enforcement to find a location. If you cannot return the medication, check the label or package insert to see if the medication should be thrown out in the garbage or flushed down the toilet. If you are not sure, ask your care team. If it is safe to put it in the trash, empty the medication out of the container. Mix it with cat litter, dirt, coffee grounds, or another unwanted substance. Seal the mixture in a bag or container. Put it in the trash. NOTE: This sheet is a summary. It may not cover all possible information. If you have questions about this medicine, talk to your doctor, pharmacist, or health care provider.  2025 Elsevier/Gold Standard (2024-03-19 00:00:00)

## 2024-08-07 LAB — TSH: TSH: 2.21 u[IU]/mL (ref 0.450–4.500)

## 2024-08-11 ENCOUNTER — Telehealth: Payer: Self-pay

## 2024-08-11 NOTE — Telephone Encounter (Signed)
 Azobou Tonleu, Joelle DEL, MD to [facility] Triage (Selected Message)   08/11/24 10:17 AM Hi Aleck,      Patient can stop taking the medication. We will decide on trying a different one based on the results of the ultrasound.   Thanks [employer]

## 2024-08-11 NOTE — Telephone Encounter (Signed)
 Spoke with patent. Provided MD recommendations -- med list updated. Intolarance list updated.   He also noted the following: HR low in left arm HR low in right arm 1 hour later BP was good No readings

## 2024-08-11 NOTE — Telephone Encounter (Signed)
 Spoke with pt regarding his medication. Pt stated he was prescribed Metoprolol  Succiante on 9/17 and took it for the first time either 9/19 or 9/20 evening (pt couldn't remember) as instructed and it caused him to be so dizzy that he had to lie down and grab on to his head board to feel steady. Pt stated his head and eyes were spinning. Pt stated he eventually fell asleep but when he woke up to go to the bathroom he was still dizzy but less severe. Pt stated he had diarrhea the next morning. Pt stated he has a lot of drug allergies and will not take this medication again. Pt asked how to dispose of the medication. Pt was told to wait until Dr. Ren gives his suggestions. Pt stated he refuses to take the medication again. Pt denied any chest pain or shortness of breath. Pt was told Dr. Ren would be notified. Pt verbalized understanding. All questions if any were answered. Pt also noted that any medications should be sent to [facility], not [facility].

## 2024-08-11 NOTE — Telephone Encounter (Signed)
 Pt c/o medication issue:  1. Name of Medication: metoprolol  succinate (TOPROL  XL) 25 MG 24 hr tablet   2. How are you currently taking this medication (dosage and times per day)? As written  3. Are you having a reaction (difficulty breathing--STAT)? No   4. What is your medication issue? Pt stopped taking this medication due to it making him extremely dizzy.

## 2024-09-08 ENCOUNTER — Ambulatory Visit (HOSPITAL_COMMUNITY)
Admission: RE | Admit: 2024-09-08 | Discharge: 2024-09-08 | Disposition: A | Discharge status: Home / Self Care | Source: Ambulatory Visit | Attending: Cardiology | Admitting: Cardiology

## 2024-09-08 DIAGNOSIS — I517 Cardiomegaly: Secondary | ICD-10-CM | POA: Insufficient documentation

## 2024-09-08 DIAGNOSIS — I493 Ventricular premature depolarization: Secondary | ICD-10-CM | POA: Insufficient documentation

## 2024-09-08 LAB — ECHOCARDIOGRAM COMPLETE
AR max vel: 2.55 cm2
AV Area VTI: 2.69 cm2
AV Area mean vel: 2.54 cm2
AV Mean grad: 5 mmHg
AV Peak grad: 10.1 mmHg
Ao pk vel: 1.59 m/s
Area-P 1/2: 3.89 cm2
S' Lateral: 5 cm

## 2024-09-19 DIAGNOSIS — I493 Ventricular premature depolarization: Secondary | ICD-10-CM | POA: Diagnosis not present

## 2024-11-04 NOTE — Progress Notes (Unsigned)
°   °  °  Cardiology Office Note Date:  11/06/2024  ID:  Donald Kim, DOB 01/22/1951, MRN [id number] PCP:  Chrystie Debby CROME., MD  Cardiologist:   Joelle VEAR Ren Donley, MD  Chief Complaint  Patient presents with   Cardiomyopathy     Problems Irregular HR w/ PVCs ECG w/ high PVC burden- RVOT pattern EM 10/25 w/ PVC burden 33% TTE 10/25: 20-25%, GH,  Atherosclerosis on CT Pre-DM HA1C 5.7 7/25 CKD3 Bradycardia M: ELEONORE PEELS, AN40  Visits  09/25: metop XL 25, TTE, TSH, ziopatch 48 hrs and follow up 3 months--> had to stop metop due to bradycardia 12/25: repeat TTE w/ contrast to assess for LVT, coronary CTA for ischemic eval, EP referral for PVCs (cannot tolerate BB), LN12.5, follow up in 2 weeks with APP and 1 month with me     History of Present Illness:  Discussed the use of AI scribe software for clinical note transcription with the patient, who gave verbal consent to proceed. Donald Kim is a 74 year old male who presents for follow up. An ultrasound previously showed an ejection fraction of 20-25%. He feels well without chest pain, dyspnea, orthopnea, or PND. He has frequent premature ventricular contractions with monitoring showing a 33% PVC burden. He is off rate-control therapy due to intolerance to metoprolol , which caused bradycardia. He has chronic foot and knee pain and prior vertebral injuries that limit his ability to stay active. He denies leg swelling other than chronic swelling of the left ankle related to prior fractures and instability. He has multiple allergies and is concerned about starting new medications. He is not aware of an iodine allergy.    ROS: Otherwise negative  Physical Exam VS:  BP 124/60 (BP Location: Left Arm, Patient Position: Sitting, Cuff Size: Normal)   Pulse (!) 44   Ht 5' 10 (1.778 m)   Wt 246 lb (111.6 kg)   SpO2 96%   BMI 35.30 kg/m  , BMI Body mass index is 35.3 kg/m. GEN: Well nourished, well developed, in no acute  distress HEENT: normal Neck: no JVD, carotid bruits, or masses Cardiac: RRR; no murmurs, rubs, or gallops,no edema  Respiratory:  CTAB bilaterally, normal work of breathing GI: soft, nontender, nondistended, + BS Extremities: No LE edema Skin: warm and dry, no rash Neuro:  Strength and sensation are intact  Recent Labs: Reviewed  ASSESSMENT AND PLAN Donald Kim is a 73 y.o. male who presents for follow up.     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 Differential includes coronary artery disease and frequent PVCs. - Ordered coronary CT angiography to rule out coronary artery disease. - Ordered BMP/Mag - Started losartan  12.5 mg daily. - APP in 2 weeks for GDMT optimization  Frequent premature ventricular contractions Frequent PVCs at 33%, contributing to reduced heart function. Metoprolol  not tolerated due to bradycardia.  - Referred to electrophysiologist for evaluation and management of PVCs.  Suspected left ventricular thrombus Ultrasound suggested possible left ventricular thrombus, requiring further evaluation. - Ordered 2D echocardiogram with contrast to evaluate for left ventricular thrombus.      Signed, Joelle VEAR Ren Donley, MD  11/06/2024 10:12 AM    [facility]

## 2024-11-06 ENCOUNTER — Ambulatory Visit: Attending: Cardiovascular Disease

## 2024-11-06 VITALS — BP 124/60 | HR 44 | Ht 70.0 in | Wt 246.0 lb

## 2024-11-06 DIAGNOSIS — I10 Essential (primary) hypertension: Secondary | ICD-10-CM

## 2024-11-06 DIAGNOSIS — E785 Hyperlipidemia, unspecified: Secondary | ICD-10-CM | POA: Diagnosis not present

## 2024-11-06 DIAGNOSIS — I420 Dilated cardiomyopathy: Secondary | ICD-10-CM | POA: Diagnosis not present

## 2024-11-06 DIAGNOSIS — I493 Ventricular premature depolarization: Secondary | ICD-10-CM

## 2024-11-06 LAB — BASIC METABOLIC PANEL WITH GFR
BUN/Creatinine Ratio: 12 (ref 10–24)
BUN: 21 mg/dL (ref 8–27)
CO2: 21 mmol/L (ref 20–29)
Calcium: 9.6 mg/dL (ref 8.6–10.2)
Chloride: 102 mmol/L (ref 96–106)
Creatinine, Ser: 1.79 mg/dL — ABNORMAL HIGH (ref 0.76–1.27)
Glucose: 103 mg/dL — ABNORMAL HIGH (ref 70–99)
Potassium: 4.4 mmol/L (ref 3.5–5.2)
Sodium: 140 mmol/L (ref 134–144)
eGFR: 39 mL/min/1.73 — ABNORMAL LOW (ref >59)

## 2024-11-06 LAB — MAGNESIUM: Magnesium: 2.1 mg/dL (ref 1.6–2.3)

## 2024-11-06 MED ORDER — LOSARTAN POTASSIUM 25 MG PO TABS: 12.5000 mg | ORAL_TABLET | Freq: Every day | ORAL | 3 refills | Status: DC

## 2024-11-06 NOTE — Patient Instructions (Addendum)
 Medication Instructions:  LOSARTAN  12.5 MG ONCE DAILY = 1/2 OF A 25MG  TABLET ONCE DAILY *If you need a refill on your cardiac medications before your next appointment, please call your pharmacy*   Testing/Procedures: Your physician has requested that you have an echocardiogram. Echocardiography is a painless test that uses sound waves to create images of your heart. It provides your doctor with information about the size and shape of your heart and how well your hearts chambers and valves are working. This procedure takes approximately one hour. There are no restrictions for this procedure. Please do NOT wear cologne, perfume, aftershave, or lotions (deodorant is allowed). Please arrive 15 minutes prior to your appointment time.  Please note: We ask at that you not bring children with you during ultrasound (echo/ vascular) testing. Due to room size and safety concerns, children are not allowed in the ultrasound rooms during exams. Our front office staff cannot provide observation of children in our lobby area while testing is being conducted. An adult accompanying a patient to their appointment will only be allowed in the ultrasound room at the discretion of the ultrasound technician under special circumstances. We apologize for any inconvenience. [address]  Follow-Up: At [facility], you and your health needs are our priority.  As part of our continuing mission to provide you with exceptional heart care, our providers are all part of one team.  This team includes your primary Cardiologist (physician) and Advanced Practice Providers or APPs (Physician Assistants and Nurse Practitioners) who all work together to provide you with the care you need, when you need it.  Your next appointment:   2 week(s)  Provider:   One of our Advanced Practice Providers (APPs): Morse Clause, PA-C  Lamarr Satterfield, NP Miriam Shams, NP  Olivia Pavy, PA-C Josefa Beauvais, NP  Leontine Salen,  PA-C Orren Fabry, PA-C  [place], PA-C Ernest Dick, NP  Damien Braver, NP Jon Hails, PA-C  Waddell Donath, PA-C    Dayna Dunn, PA-C  Scott Weaver, PA-C Lum Louis, NP Katlyn West, NP Callie Goodrich, PA-C  Xika Zhao, NP Sheng Haley, PA-C    Kathleen Shenee Wignall, PA-C   Then, DR REN will plan to see you again in 1 month(s).    We recommend signing up for the patient portal called MyChart.  Sign up information is provided on this After Visit Summary.  MyChart is used to connect with patients for Virtual Visits (Telemedicine).  Patients are able to view lab/test results, encounter notes, upcoming appointments, etc.  Non-urgent messages can be sent to your provider as well.   To learn more about what you can do with MyChart, go to [email or web address].   Other Instructions   Your cardiac CT will be scheduled at one of the below locations:    [facility] [address]  [place], [place] [address]   If scheduled at the [facility] at [address], please enter the parking lot using the [address] entrance and use the FREE valet service at the patient drop-off area. Enter the building and check-in with registration on the main floor.   Please follow these instructions carefully (unless otherwise directed):  An IV will be required for this test and Nitroglycerin will be given.  Hold all erectile dysfunction medications at least 3 days (72 hrs) prior to test. (Ie viagra, cialis, sildenafil, tadalafil, etc)   On the Night Before the Test: Be sure to Drink plenty of water . Do not  consume any caffeinated/decaffeinated beverages or chocolate 12 hours prior to your test. Do not take any antihistamines 12 hours prior to your test.   On the Day of the Test: Drink plenty of water  until 1 hour prior to the test. Do not eat any food 1 hour prior to test. You may take your regular medications prior to the test.  Patients who wear a  continuous glucose monitor MUST remove the device prior to scanning.  After the Test: Drink plenty of water . After receiving IV contrast, you may experience a mild flushed feeling. This is normal. On occasion, you may experience a mild rash up to 24 hours after the test. This is not dangerous. If this occurs, you can take Benadryl  25 mg, Zyrtec, Claritin, or Allegra and increase your fluid intake. (Patients taking Tikosyn should avoid Benadryl , and may take Zyrtec, Claritin, or Allegra) If you experience trouble breathing, this can be serious. If it is severe call 911 IMMEDIATELY. If it is mild, please call our office.  We will call to schedule your test 2-4 weeks out understanding that some insurance companies will need an authorization prior to the service being performed.   For more information and frequently asked questions, please visit our website : [email or web address]  For non-scheduling related questions, please contact the cardiac imaging nurse navigator should you have any questions/concerns: Cardiac Imaging Nurse Navigators Direct Office Dial: [phone]   For scheduling needs, including cancellations and rescheduling, please call Brittany, [phone].

## 2024-11-07 ENCOUNTER — Ambulatory Visit: Payer: Self-pay

## 2024-11-07 DIAGNOSIS — I493 Ventricular premature depolarization: Secondary | ICD-10-CM

## 2024-11-24 ENCOUNTER — Ambulatory Visit (HOSPITAL_COMMUNITY)
Admission: RE | Admit: 2024-11-24 | Discharge: 2024-11-24 | Disposition: A | Discharge status: Home / Self Care | Source: Ambulatory Visit

## 2024-11-24 DIAGNOSIS — I420 Dilated cardiomyopathy: Secondary | ICD-10-CM | POA: Insufficient documentation

## 2024-11-24 MED ORDER — IOHEXOL 350 MG/ML SOLN
100.0000 mL | Freq: Once | INTRAVENOUS | Status: AC | PRN
  Administered 2024-11-24: 100 mL via INTRAVENOUS

## 2024-11-24 MED ORDER — NITROGLYCERIN 0.4 MG SL SUBL
0.8000 mg | SUBLINGUAL_TABLET | Freq: Once | SUBLINGUAL | Status: AC
  Administered 2024-11-24: 0.8 mg via SUBLINGUAL

## 2024-11-27 NOTE — Telephone Encounter (Signed)
"   I called the patient to discuss the results of his coronary CTA.  He had nonobstructive CAD, suggesting that his cardiomyopathy is likely from PVC burden.  He has been tolerating the losartan  so far.  He had not heard back from EP clinic yet, so we will re-place the referral.  Joelle DEL. Ren Ny, MD [facility]  "

## 2024-11-27 NOTE — Addendum Note (Signed)
 Addended by: REN DONLEY PRADER on: 11/27/2024 09:27 AM   Modules accepted: Orders

## 2024-12-08 NOTE — Progress Notes (Unsigned)
 "      Cardiology Office Note Date:  12/09/2024  ID:  Donald Kim, DOB [birth year], MRN [id number] PCP:  Chrystie Debby CROME., MD  Cardiologist:  Joelle VEAR Ren Donley, MD  Chief Complaint  Patient presents with   Cardiomyopathy      Problems NICM EM 10/25 w/ PVC burden 33%- RVOT Pattern TTE 10/25: 20-25%, GH CCTA 1/26: CAC 529, Non-obstructive CAD Non-obstructive CAD CKD3 M: AE5, ASA81, AN40, LN12.5  Visits  09/25: metop XL 25, TTE, TSH, ziopatch 48 hrs and follow up 3 months--> had to stop metop due to bradycardia 12/25: repeat TTE w/ contrast to assess for LVT, coronary CTA for ischemic eval, EP referral for PVCs (cannot tolerate BB), LN12.5, follow up in 2 weeks with APP and 1 month with me 1/26: Repeat TTE pending, LP, Transition LN to SV24-26, add EN10, EP not scheduled yet?     ROS: Otherwise negative Discussed the use of AI scribe software for clinical note transcription with the patient, who gave verbal consent to proceed.  History of Present Illness Donald Kim is a 74 year old male with hypertension and irregular heartbeat who presents for cardiovascular follow-up. He has been feeling better overall since his last visit and has been monitoring his blood pressure at home using a wrist cuff. Blood pressure readings have been generally good, with occasional high readings, the highest being 180/90 mmHg. His blood pressure tends to be higher when he wakes up from a reclined position. He checks his blood pressure more frequently when he feels 'a little funky', sometimes up to three times a day, and notes that it often returns to normal after a few hours. He has a history of irregular heartbeat, which he has noticed on his home blood pressure machine. He recalls a recent scan and reports that he was told there was no blockage. He has been taking a prescribed medication at a dose of 25 mg without any issues, although he finds the pills difficult to cut in half due to their small size.  He mentions a significant reduction in stress since retiring, which he believes has positively impacted his health. He recalls being under considerable stress at work, which was noted by a nurse during a visit to his former workplace. He is currently taking amlodipine  for his blood pressure.    Physical Exam VS:  BP 122/75 (BP Location: Left Arm, Patient Position: Sitting)   Pulse 64   Ht 5' 10 (1.778 m)   Wt 250 lb 3.2 oz (113.5 kg)   SpO2 97%   BMI 35.90 kg/m  , BMI Body mass index is 35.9 kg/m. GEN: Well nourished, well developed, in no acute distress HEENT: normal Neck: no JVD, carotid bruits, or masses Cardiac: RRR; no murmurs, rubs, or gallops,no edema  Respiratory:  CTAB bilaterally, normal work of breathing GI: soft, nontender, nondistended, + BS Extremities: No LE edema Skin: warm and dry, no rash Neuro:  Strength and sensation are intact  Recent Labs: Reviewed  Assessment and Plan Assessment & Plan Dilated cardiomyopathy with reduced ejection fraction Heart function low, likely due to extra beats. Adjusted medication for heart recovery and blood pressure control. - Switched losartan  to Entresto  24-26 - Added Empagliflozin . - Will add the third medication in one month.  Premature ventricular contractions Irregular heartbeat noted. No significant blockage. Referral to electrical specialist planned. - Referred to an electrical specialist for evaluation and management. - Continue current medication regimen with adjustments as discussed.  Primary hypertension Home  blood pressure readings variable with occasional high readings. Adjusted medication for better control. - Provided a blood pressure log for daily morning readings. - Monitor blood pressure and report any significant changes.  Hyperlipidemia Due for cholesterol check. - Ordered cholesterol check at [facility] or any available lab.  Suspected left ventricular thrombus Initial ultrasound inconclusive for clot  presence. Repeat test scheduled. - Scheduled repeat ultrasound on January 27th to assess for left ventricular thrombus.   Signed, Joelle VEAR Ren Donley, MD  12/09/2024 10:17 AM     [facility] "

## 2024-12-09 ENCOUNTER — Ambulatory Visit

## 2024-12-09 VITALS — BP 122/75 | HR 64 | Ht 70.0 in | Wt 250.2 lb

## 2024-12-09 DIAGNOSIS — I493 Ventricular premature depolarization: Secondary | ICD-10-CM

## 2024-12-09 DIAGNOSIS — N1831 Chronic kidney disease, stage 3a: Secondary | ICD-10-CM

## 2024-12-09 DIAGNOSIS — I420 Dilated cardiomyopathy: Secondary | ICD-10-CM

## 2024-12-09 DIAGNOSIS — E785 Hyperlipidemia, unspecified: Secondary | ICD-10-CM

## 2024-12-09 DIAGNOSIS — I10 Essential (primary) hypertension: Secondary | ICD-10-CM | POA: Diagnosis not present

## 2024-12-09 MED ORDER — SACUBITRIL-VALSARTAN 24-26 MG PO TABS: 1.0000 | ORAL_TABLET | Freq: Two times a day (BID) | ORAL | 3 refills | Status: AC

## 2024-12-09 MED ORDER — EMPAGLIFLOZIN 10 MG PO TABS: 10.0000 mg | ORAL_TABLET | Freq: Every day | ORAL | 3 refills | Status: AC

## 2024-12-09 NOTE — Patient Instructions (Addendum)
 Medication Instructions:  Stop Losartan   Start Entresto  24-26 mg take one tablet twice daily  Start Jardiance  10 mg take one tablet daily *If you need a refill on your cardiac medications before your next appointment, please call your pharmacy*  Lab Work: Today- Lipids If you have labs (blood work) drawn today and your tests are completely normal, you will receive your results only by: MyChart Message (if you have MyChart) OR A paper copy in the mail If you have any lab test that is abnormal or we need to change your treatment, we will call you to review the results.   Follow-Up: At [facility], you and your health needs are our priority.  As part of our continuing mission to provide you with exceptional heart care, our providers are all part of one team.  This team includes your primary Cardiologist (physician) and Advanced Practice Providers or APPs (Physician Assistants and Nurse Practitioners) who all work together to provide you with the care you need, when you need it.  Your next appointment:   1 month(s)  Provider:   Joelle VEAR Ren Donley, MD    We recommend signing up for the patient portal called MyChart.  Sign up information is provided on this After Visit Summary.  MyChart is used to connect with patients for Virtual Visits (Telemedicine).  Patients are able to view lab/test results, encounter notes, upcoming appointments, etc.  Non-urgent messages can be sent to your provider as well.   To learn more about what you can do with MyChart, go to [email or web address].   Other Instructions Monitor your blood pressure and bring readings to your next appointment  We need to get a better idea of what your blood pressure is running at home. Here are some instructions to follow: - I would recommend using a blood pressure cuff that goes on your arm. The wrist ones can be inaccurate. If you're purchasing one for the first time, try to select one that also reports your  heart rate because this can be helpful information as well. - To check your blood pressure, choose a time at least 3 hours after taking your blood pressure medicines. If you can sample it at different times of the day, that's great - it might give you more information about how your blood pressure fluctuates. Remain seated in a chair for 5 minutes quietly beforehand, then check it.  - Please record a list of those readings and call us /send in MyChart message    Referral sent ot Electrphysiology

## 2024-12-10 LAB — LIPID PANEL
Chol/HDL Ratio: 3.4 ratio (ref 0.0–5.0)
Cholesterol, Total: 152 mg/dL (ref 100–199)
HDL: 45 mg/dL
LDL Chol Calc (NIH): 83 mg/dL (ref 0–99)
Triglycerides: 139 mg/dL (ref 0–149)
VLDL Cholesterol Cal: 24 mg/dL (ref 5–40)

## 2024-12-16 ENCOUNTER — Ambulatory Visit (HOSPITAL_COMMUNITY)
Admission: RE | Admit: 2024-12-16 | Discharge: 2024-12-16 | Disposition: A | Discharge status: Home / Self Care | Source: Ambulatory Visit

## 2024-12-16 DIAGNOSIS — I428 Other cardiomyopathies: Secondary | ICD-10-CM | POA: Diagnosis not present

## 2024-12-16 DIAGNOSIS — I420 Dilated cardiomyopathy: Secondary | ICD-10-CM | POA: Insufficient documentation

## 2024-12-16 LAB — ECHOCARDIOGRAM COMPLETE
Area-P 1/2: 2.47 cm2
S' Lateral: 4.3 cm

## 2024-12-16 MED ORDER — PERFLUTREN LIPID MICROSPHERE
1.0000 mL | INTRAVENOUS | Status: AC | PRN
  Administered 2024-12-16: 1 mL via INTRAVENOUS

## 2024-12-17 ENCOUNTER — Ambulatory Visit (HOSPITAL_COMMUNITY)

## 2024-12-19 NOTE — Telephone Encounter (Signed)
"   I called the patient and explained that the echo showed improvement in his heart function. He has been tolerating the new medication very well. Plan to me and EP later next month.  Joelle DEL. Ren Ny, MD [facility]  "

## 2025-01-07 ENCOUNTER — Ambulatory Visit: Admitting: Cardiology

## 2025-01-09 ENCOUNTER — Ambulatory Visit
